# Patient Record
Sex: Male | Born: 1945 | Race: White | Hispanic: No | Marital: Single | Smoking: Never smoker
Health system: Southern US, Community
[De-identification: ages and names within clinical notes are randomized; demographics above are authoritative.]

## PROBLEM LIST (undated history)

## (undated) DIAGNOSIS — F329 Major depressive disorder, single episode, unspecified: Secondary | ICD-10-CM

## (undated) DIAGNOSIS — F419 Anxiety disorder, unspecified: Secondary | ICD-10-CM

## (undated) DIAGNOSIS — K219 Gastro-esophageal reflux disease without esophagitis: Secondary | ICD-10-CM

## (undated) DIAGNOSIS — F32A Depression, unspecified: Secondary | ICD-10-CM

## (undated) DIAGNOSIS — G459 Transient cerebral ischemic attack, unspecified: Secondary | ICD-10-CM

## (undated) DIAGNOSIS — I10 Essential (primary) hypertension: Secondary | ICD-10-CM

## (undated) HISTORY — PX: APPENDECTOMY: SHX54

## (undated) HISTORY — PX: ROTATOR CUFF REPAIR: SHX139

## (undated) HISTORY — PX: BACK SURGERY: SHX140

---
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
## 2015-07-04 ENCOUNTER — Emergency Department
Admission: EM | Admit: 2015-07-04 | Discharge: 2015-07-04 | Disposition: A | Discharge status: Home / Self Care | Payer: Medicare Other | Attending: Emergency Medicine | Admitting: Emergency Medicine

## 2015-07-04 ENCOUNTER — Encounter: Payer: Self-pay | Admitting: Emergency Medicine

## 2015-07-04 DIAGNOSIS — S61432A Puncture wound without foreign body of left hand, initial encounter: Secondary | ICD-10-CM | POA: Diagnosis not present

## 2015-07-04 DIAGNOSIS — Y929 Unspecified place or not applicable: Secondary | ICD-10-CM | POA: Diagnosis not present

## 2015-07-04 DIAGNOSIS — T148XXA Other injury of unspecified body region, initial encounter: Secondary | ICD-10-CM

## 2015-07-04 DIAGNOSIS — Y999 Unspecified external cause status: Secondary | ICD-10-CM | POA: Diagnosis not present

## 2015-07-04 DIAGNOSIS — S6992XA Unspecified injury of left wrist, hand and finger(s), initial encounter: Secondary | ICD-10-CM | POA: Diagnosis present

## 2015-07-04 DIAGNOSIS — W273XXA Contact with needle (sewing), initial encounter: Secondary | ICD-10-CM | POA: Diagnosis not present

## 2015-07-04 DIAGNOSIS — Y9389 Activity, other specified: Secondary | ICD-10-CM | POA: Diagnosis not present

## 2015-07-04 DIAGNOSIS — L03114 Cellulitis of left upper limb: Secondary | ICD-10-CM | POA: Insufficient documentation

## 2015-07-04 LAB — BASIC METABOLIC PANEL
Anion gap: 8 (ref 5–15)
BUN: 33 mg/dL — ABNORMAL HIGH (ref 6–20)
CALCIUM: 9.6 mg/dL (ref 8.9–10.3)
CHLORIDE: 104 mmol/L (ref 101–111)
CO2: 28 mmol/L (ref 22–32)
Creatinine, Ser: 1.59 mg/dL — ABNORMAL HIGH (ref 0.61–1.24)
GFR calc non Af Amer: 43 mL/min — ABNORMAL LOW (ref >60)
GFR, EST AFRICAN AMERICAN: 50 mL/min — AB (ref >60)
Glucose, Bld: 103 mg/dL — ABNORMAL HIGH (ref 65–99)
POTASSIUM: 4.1 mmol/L (ref 3.5–5.1)
Sodium: 140 mmol/L (ref 135–145)

## 2015-07-04 LAB — CBC
HEMATOCRIT: 39.7 % — AB (ref 40.0–52.0)
HEMOGLOBIN: 12.9 g/dL — AB (ref 13.0–18.0)
MCH: 29.5 pg (ref 26.0–34.0)
MCHC: 32.5 g/dL (ref 32.0–36.0)
MCV: 90.7 fL (ref 80.0–100.0)
Platelets: 172 10*3/uL (ref 150–440)
RBC: 4.38 MIL/uL — ABNORMAL LOW (ref 4.40–5.90)
RDW: 14 % (ref 11.5–14.5)
WBC: 9.5 10*3/uL (ref 3.8–10.6)

## 2015-07-04 MED ORDER — CEPHALEXIN 500 MG PO CAPS: 500.0000 mg | ORAL_CAPSULE | Freq: Four times a day (QID) | ORAL | Status: AC

## 2015-07-04 MED ORDER — SULFAMETHOXAZOLE-TRIMETHOPRIM 800-160 MG PO TABS: 1.0000 | ORAL_TABLET | Freq: Two times a day (BID) | ORAL | Status: DC

## 2015-07-04 MED ORDER — PIPERACILLIN-TAZOBACTAM 3.375 G IVPB
3.3750 g | Freq: Once | INTRAVENOUS | Status: AC
  Administered 2015-07-04: 3.375 g via INTRAVENOUS
  Filled 2015-07-04: qty 50

## 2015-07-04 MED ORDER — VANCOMYCIN HCL IN DEXTROSE 1-5 GM/200ML-% IV SOLN
1000.0000 mg | Freq: Once | INTRAVENOUS | Status: AC
  Administered 2015-07-04: 1000 mg via INTRAVENOUS
  Filled 2015-07-04: qty 200

## 2015-07-04 NOTE — ED Provider Notes (Signed)
[facility] Emergency Department Provider Note        Time seen: ----------------------------------------- 4:03 PM on 07/04/2015 -----------------------------------------    I have reviewed the triage vital signs and the nursing notes.   HISTORY  Chief Complaint Cellulitis    HPI Frederick Hood is a 70 y.o. male who presents to ER for left hand redness swelling after accidentallyinjecting himself with cattle vaccine yesterday. This was cleared from a poison control perspective. Patient states the needle he stuck himself with had probably been used on 10 cattle. He notes over the last 24 hours redness and swelling has progressed around his hand and wrist. He complains of mild pain and a burning sensation in his hand. Hand is markedly swollen.   History reviewed. No pertinent past medical history.  There are no active problems to display for this patient.   Past Surgical History  Procedure Laterality Date  . Back surgery    . Appendectomy      Allergies Review of patient's allergies indicates no known allergies.  Social History Social History  Substance Use Topics  . Smoking status: Never Smoker   . Smokeless tobacco: None  . Alcohol Use: Yes   Review of Systems Constitutional: Negative for fever. Cardiovascular: Negative for chest pain. Respiratory: Negative for shortness of breath. Musculoskeletal: Positive for left hand swelling Skin: Positive for left hand and wrist erythema with swelling Neurological: Negative for headaches, focal weakness or numbness.  10-point ROS otherwise negative.  ____________________________________________   PHYSICAL EXAM:  VITAL SIGNS: ED Triage Vitals  Enc Vitals Group     BP 07/04/15 1403 130/63 mmHg     Pulse Rate 07/04/15 1403 60     Resp 07/04/15 1403 18     Temp --      Temp Source 07/04/15 1403 Oral     SpO2 07/04/15 1403 99 %     Weight 07/04/15 1403 180 lb (81.647 kg)     Height 07/04/15  1403 6\' 1"  (1.854 m)     Head Cir --      Peak Flow --      Pain Score 07/04/15 1409 0     Pain Loc --      Pain Edu? --      Excl. in GC? --    Constitutional: Alert and oriented. Well appearing and in no distress. Eyes: Conjunctivae are normal. PERRL. Normal extraocular movements. ENT   Head: Normocephalic and atraumatic.   Nose: No congestion/rhinnorhea.   Mouth/Throat: Mucous membranes are moist.   Neck: No stridor. Cardiovascular: Normal rate, regular rhythm. No murmurs, rubs, or gallops. Respiratory: Normal respiratory effort without tachypnea nor retractions. Breath sounds are clear and equal bilaterally. No wheezes/rales/rhonchi. Musculoskeletal: Left hand is diffusely swollen, particularly over the palmar aspect. It is of erythema over the palmar aspect extending into the wrist medially. There is a puncture wound noted over the mid-palmar aspect. He has limited but grossly unremarkable range of motion of his fingers. Neurologic:  Normal speech and language. No gross focal neurologic deficits are appreciated.  Skin:  Erythema, edema is noted in the left hand Psychiatric: Mood and affect are normal. Speech and behavior are normal.   ____________________________________________  ED COURSE:  Pertinent labs & imaging results that were available during my care of the patient were reviewed by me and considered in my medical decision making (see chart for details). Patient is in no acute distress, there is concern for developing infection in his left hand. He'll  receive IV vancomycin and Zosyn. I will discuss with orthopedics. ____________________________________________    LABS (pertinent positives/negatives)  Labs Reviewed  CBC - Abnormal; Notable for the following:    RBC 4.38 (*)    Hemoglobin 12.9 (*)    HCT 39.7 (*)    All other components within normal limits  BASIC METABOLIC PANEL - Abnormal; Notable for the following:    Glucose, Bld 103 (*)    BUN 33  (*)    Creatinine, Ser 1.59 (*)    GFR calc non Af Amer 43 (*)    GFR calc Af Amer 50 (*)    All other components within normal limits   ____________________________________________  FINAL ASSESSMENT AND PLAN  Cellulitis, puncture wound  Plan: Patient with labs as dictated above. Patient has received IV vancomycin and Zosyn here. Patient has no appointment tomorrow at 9:45 AM with Dr. Hyacinth Meeker. We will place him in a wrist brace, he'll be discharged with Septra and Keflex is advised to return for worsening symptoms.   Emily Filbert, MD   Note: This dictation was prepared with Dragon dictation. Any transcriptional errors that result from this process are unintentional   Emily Filbert, MD 07/04/15 1732

## 2015-07-04 NOTE — ED Notes (Signed)
Pt present to ED with left hand redness and swelling after accidentally injecting himself with a cattle vaccine yesterday. Pt states the needle he stuck himself with had probably been used on 10 cattle.

## 2015-07-04 NOTE — Discharge Instructions (Signed)
Cellulitis Cellulitis is an infection of the skin and the tissue beneath it. The infected area is usually red and tender. Cellulitis occurs most often in the arms and lower legs.  CAUSES  Cellulitis is caused by bacteria that enter the skin through cracks or cuts in the skin. The most common types of bacteria that cause cellulitis are staphylococci and streptococci. SIGNS AND SYMPTOMS   Redness and warmth.  Swelling.  Tenderness or pain.  Fever. DIAGNOSIS  Your health care provider can usually determine what is wrong based on a physical exam. Blood tests may also be done. TREATMENT  Treatment usually involves taking an antibiotic medicine. HOME CARE INSTRUCTIONS   Take your antibiotic medicine as directed by your health care provider. Finish the antibiotic even if you start to feel better.  Keep the infected arm or leg elevated to reduce swelling.  Apply a warm cloth to the affected area up to 4 times per day to relieve pain.  Take medicines only as directed by your health care provider.  Keep all follow-up visits as directed by your health care provider. SEEK MEDICAL CARE IF:   You notice red streaks coming from the infected area.  Your red area gets larger or turns dark in color.  Your bone or joint underneath the infected area becomes painful after the skin has healed.  Your infection returns in the same area or another area.  You notice a swollen bump in the infected area.  You develop new symptoms.  You have a fever. SEEK IMMEDIATE MEDICAL CARE IF:   You feel very sleepy.  You develop vomiting or diarrhea.  You have a general ill feeling (malaise) with muscle aches and pains.   This information is not intended to replace advice given to you by your health care provider. Make sure you discuss any questions you have with your health care provider.   Document Released: 11/19/2004 Document Revised: 10/31/2014 Document Reviewed: 04/27/2011 Elsevier Interactive  Patient Education 2016 Elsevier Inc. Puncture Wound A puncture wound is an injury that is caused by a sharp, thin object that goes through (penetrates) your skin. Usually, a puncture wound does not leave a large opening in your skin, so it may not bleed a lot. However, when you get a puncture wound, dirt or other materials (foreign bodies) can be forced into your wound and break off inside. This increases the chance of infection, such as tetanus. CAUSES Puncture wounds are caused by any sharp, thin object that goes through your skin, such as:  Animal teeth, as with an animal bite.  Sharp, pointed objects, such as nails, splinters of glass, fishhooks, and needles. SYMPTOMS Symptoms of a puncture wound include:  Pain.  Bleeding.  Swelling.  Bruising.  Fluid leaking from the wound.  Numbness, tingling, or loss of function. DIAGNOSIS This condition is diagnosed with a medical history and physical exam. Your wound will be checked to see if it contains any foreign bodies. You may also have X-rays or other imaging tests. TREATMENT Treatment for a puncture wound depends on how serious the wound is. It also depends on whether the wound contains any foreign bodies. Treatment for all types of puncture wounds usually starts with:  Controlling the bleeding.  Washing out the wound with a germ-free (sterile) salt-water solution.  Checking the wound for foreign bodies. Treatment may also include:  Having the wound opened surgically to remove a foreign object.  Closing the wound with stitches (sutures) if it continues to bleed.  Covering the wound with antibiotic ointments and a bandage (dressing).  Receiving a tetanus shot.  Receiving a rabies vaccine. HOME CARE INSTRUCTIONS Medicines  Take or apply over-the-counter and prescription medicines only as told by your health care provider.  If you were prescribed an antibiotic, take or apply it as told by your health care provider. Do  not stop using the antibiotic even if your condition improves. Wound Care  There are many ways to close and cover a wound. For example, a wound can be covered with sutures, skin glue, or adhesive strips. Follow instructions from your health care provider about:  How to take care of your wound.  When and how you should change your dressing.  When you should remove your dressing.  Removing whatever was used to close your wound.  Keep the dressing dry as told by your health care provider. Do not take baths, swim, use a hot tub, or do anything that would put your wound underwater until your health care provider approves.  Clean the wound as told by your health care provider.  Do not scratch or pick at the wound.  Check your wound every day for signs of infection. Watch for:  Redness, swelling, or pain.  Fluid, blood, or pus. General Instructions  Raise (elevate) the injured area above the level of your heart while you are sitting or lying down.  If your puncture wound is in your foot, ask your health care provider if you need to avoid putting weight on your foot and for how long.  Keep all follow-up visits as told by your health care provider. This is important. SEEK MEDICAL CARE IF:  You received a tetanus shot and you have swelling, severe pain, redness, or bleeding at the injection site.  You have a fever.  Your sutures come out.  You notice a bad smell coming from your wound or your dressing.  You notice something coming out of your wound, such as wood or glass.  Your pain is not controlled with medicine.  You have increased redness, swelling, or pain at the site of your wound.  You have fluid, blood, or pus coming from your wound.  You notice a change in the color of your skin near your wound.  You need to change the dressing frequently due to fluid, blood, or pus draining from your wound.  You develop a new rash.  You develop numbness around your wound. SEEK  IMMEDIATE MEDICAL CARE IF:  You develop severe swelling around your wound.  Your pain suddenly increases and is severe.  You develop painful skin lumps.  You have a red streak going away from your wound.  The wound is on your hand or foot and you cannot properly move a finger or toe.  The wound is on your hand or foot and you notice that your fingers or toes look pale or bluish.   This information is not intended to replace advice given to you by your health care provider. Make sure you discuss any questions you have with your health care provider.   Document Released: 11/19/2004 Document Revised: 10/31/2014 Document Reviewed: 04/04/2014 Elsevier Interactive Patient Education [employer]2016 Elsevier Inc.

## 2019-01-03 ENCOUNTER — Emergency Department
Admission: EM | Admit: 2019-01-03 | Discharge: 2019-01-03 | Disposition: A | Discharge status: Home / Self Care | Payer: Medicare Other | Attending: Emergency Medicine | Admitting: Emergency Medicine

## 2019-01-03 ENCOUNTER — Other Ambulatory Visit: Payer: Self-pay

## 2019-01-03 ENCOUNTER — Encounter: Payer: Self-pay | Admitting: Emergency Medicine

## 2019-01-03 ENCOUNTER — Emergency Department: Payer: Medicare Other

## 2019-01-03 DIAGNOSIS — M5441 Lumbago with sciatica, right side: Secondary | ICD-10-CM | POA: Insufficient documentation

## 2019-01-03 DIAGNOSIS — Z79899 Other long term (current) drug therapy: Secondary | ICD-10-CM | POA: Diagnosis not present

## 2019-01-03 DIAGNOSIS — M48061 Spinal stenosis, lumbar region without neurogenic claudication: Secondary | ICD-10-CM

## 2019-01-03 DIAGNOSIS — M545 Low back pain: Secondary | ICD-10-CM | POA: Diagnosis present

## 2019-01-03 DIAGNOSIS — G8929 Other chronic pain: Secondary | ICD-10-CM | POA: Diagnosis not present

## 2019-01-03 MED ORDER — ACETAMINOPHEN 325 MG PO TABS
650.0000 mg | ORAL_TABLET | Freq: Once | ORAL | Status: AC
  Administered 2019-01-03: 650 mg via ORAL
  Filled 2019-01-03: qty 2

## 2019-01-03 MED ORDER — METHOCARBAMOL 500 MG PO TABS: 500.0000 mg | ORAL_TABLET | Freq: Four times a day (QID) | ORAL | 0 refills | Status: AC

## 2019-01-03 MED ORDER — PREDNISONE 20 MG PO TABS
60.0000 mg | ORAL_TABLET | Freq: Once | ORAL | Status: AC
  Administered 2019-01-03: 16:00:00 60 mg via ORAL
  Filled 2019-01-03: qty 3

## 2019-01-03 MED ORDER — GABAPENTIN 300 MG PO CAPS: 300.0000 mg | ORAL_CAPSULE | Freq: Every day | ORAL | 0 refills | Status: DC

## 2019-01-03 MED ORDER — PREDNISONE 20 MG PO TABS: 40.0000 mg | ORAL_TABLET | Freq: Every day | ORAL | 0 refills | Status: AC

## 2019-01-03 NOTE — ED Triage Notes (Signed)
Pt reports has back surgery in 2011. Pt reports intermittent back pain since then. Pt states for the last few days has had pain to his lower back that radiates down his right leg and makes it hard for him to walk.

## 2019-01-03 NOTE — ED Provider Notes (Signed)
[facility] Emergency Department Provider Note ____________________________________________  Time seen: 1215  I have reviewed the triage vital signs and the nursing notes.  HISTORY  Chief Complaint  Back Pain  HPI Frederick Hood is a 73 y.o. male presents to the ED accompanied by his girlfriend, for management of several days of right LBP and RLE referral. He gives a history of DDD and previous ESI. His most recent MRI was performed in 06/2017 at [facility]. He admits to 3-4 days of right LBP and radiculopathy. He reports pain in the right buttocks region and down the posterior right thigh.  Denies any bladder or bowel incontinence, foot drop, or saddle anesthesia.  He does report ankle weakness at baseline from his previous sciatic nerve irritation.  Patient does not take any current medications for his symptoms.  He has a pending appointment with Dr. Marcell Barlow at [facility] neurology on this calendar.  He also apparently has an appointment with a [facility] neurologist that he was able to secure.  Patient will likely cancel the appointment at [facility] and lieu of more local provider.  History reviewed. No pertinent past medical history.  There are no active problems to display for this patient.   Past Surgical History:  Procedure Laterality Date  . APPENDECTOMY    . BACK SURGERY      Prior to Admission medications   Medication Sig Start Date End Date Taking? Authorizing Provider  gabapentin (NEURONTIN) 300 MG capsule Take 1 capsule (300 mg total) by mouth at bedtime for 15 days. 01/03/19 01/18/19  Karlin Binion, Charlesetta Ivory, PA-C  methocarbamol (ROBAXIN) 500 MG tablet Take 1 tablet (500 mg total) by mouth 4 (four) times daily for 7 days. 01/03/19 01/10/19  Khiya Friese, Charlesetta Ivory, PA-C  predniSONE (DELTASONE) 20 MG tablet Take 2 tablets (40 mg total) by mouth daily with breakfast for 5 days. 01/03/19 01/08/19  Zayn Selley, Charlesetta Ivory, PA-C   sulfamethoxazole-trimethoprim (BACTRIM DS) 800-160 MG tablet Take 1 tablet by mouth 2 (two) times daily. 07/04/15   Emily Filbert, MD    Allergies Patient has no known allergies.  No family history on file.  Social History Social History   Tobacco Use  . Smoking status: Never Smoker  Substance Use Topics  . Alcohol use: Yes  . Drug use: No    Review of Systems  Constitutional: Negative for fever. Eyes: Negative for visual changes. ENT: Negative for sore throat. Cardiovascular: Negative for chest pain. Respiratory: Negative for shortness of breath. Gastrointestinal: Negative for abdominal pain, vomiting and diarrhea. Genitourinary: Negative for dysuria. Musculoskeletal: Positive for back pain. Skin: Negative for rash. Neurological: Negative for headaches, focal weakness or numbness. ____________________________________________  PHYSICAL EXAM:  VITAL SIGNS: ED Triage Vitals [01/03/19 1109]  Enc Vitals Group     BP      Pulse      Resp      Temp      Temp src      SpO2      Weight 180 lb (81.6 kg)     Height 6' (1.829 m)     Head Circumference      Peak Flow      Pain Score 8     Pain Loc      Pain Edu?      Excl. in GC?     Constitutional: Alert and oriented. Well appearing and in no distress. Head: Normocephalic and atraumatic. Eyes: Conjunctivae are normal. Normal extraocular movements Cardiovascular:  Normal rate, regular rhythm. Normal distal pulses. Respiratory: Normal respiratory effort. No wheezes/rales/rhonchi. Gastrointestinal: Soft and nontender. No distention. Musculoskeletal: Normal spinal alignment without midline tenderness, spasm, deformity, or step-off.  Patient with pain to the right sacroiliac region of the right buttocks.  He also reports referred pain down the posterior thigh to the posterior knee.  Patient is able demonstrate normal hip flexion and extension range in the supine position.  He is also able demonstrate a normal leg  extension in a supine position.  Nontender with normal range of motion in all extremities.  Neurologic: Cranial nerves II through XII grossly intact.  Normal LE DTRs bilaterally.  Antalgic gait without ataxia. Normal speech and language. No gross focal neurologic deficits are appreciated. Skin:  Skin is warm, dry and intact. No rash noted. ____________________________________________   RADIOLOGY  MRI Lumbar Spine   IMPRESSION: Multilevel degenerative changes as detailed above with some progression since 2014. Canal stenosis is greatest at L2-L3. Right foraminal stenosis is greatest at L4-L5 and L5-S1. ____________________________________________  PROCEDURES  Tylenol 650 mg PO Prednisone 60 mg PO Procedures ____________________________________________  INITIAL IMPRESSION / ASSESSMENT AND PLAN / ED COURSE  Patient with ED evaluation of acute on chronic low back pain secondary to underlying DDD, spinal stenosis, and foraminal stenosis.  Patient presented with acute right low back pain and radicular symptoms over the last few days.  Patient is adamant about not taking muscle relaxants and narcotic pain medicines for his symptoms.  He did agree to take a less sedating muscle relaxant as well as steroids for his acute radiculopathy.  He will follow-up with neurosurgery as scheduled, for ongoing symptom management and intervention.  Return precautions have been reviewed.  Patient and his girlfriend were very appreciative of the care received in the ED today.  He is discharged to follow-up as planned.  Frederick Hood was evaluated in Emergency Department on 01/03/2019 for the symptoms described in the history of present illness. He was evaluated in the context of the global COVID-19 pandemic, which necessitated consideration that the patient might be at risk for infection with the SARS-CoV-2 virus that causes COVID-19. Institutional protocols and algorithms that pertain to the evaluation of patients  at risk for COVID-19 are in a state of rapid change based on information released by regulatory bodies including the CDC and federal and state organizations. These policies and algorithms were followed during the patient's care in the ED. ____________________________________________  FINAL CLINICAL IMPRESSION(S) / ED DIAGNOSES  Final diagnoses:  Chronic right-sided low back pain with right-sided sciatica  Spinal stenosis at L4-L5 level      Taitum Alms, Dannielle Karvonen, PA-C 01/03/19 1625    Vanessa Thomaston, MD 01/04/19 1247

## 2019-01-03 NOTE — Discharge Instructions (Addendum)
Your exam and MRI confirm progression of your chronic of DDD, Spinal stenosis, and foraminal stenosis. Take the prescription meds as directed. Follow-up with [facility] as scheduled.

## 2019-01-03 NOTE — ED Notes (Signed)
Patient wheeled back to room by this RN. Patient here for chronic lower back pain. Denies recent injury or trauma. Denies urinary incontinence. Reports difficulty ambulating.

## 2019-02-09 ENCOUNTER — Ambulatory Visit
Admission: RE | Admit: 2019-02-09 | Discharge: 2019-02-09 | Disposition: A | Discharge status: Home / Self Care | Payer: Medicare Other | Source: Ambulatory Visit | Attending: Neurosurgery | Admitting: Neurosurgery

## 2019-02-09 ENCOUNTER — Other Ambulatory Visit: Payer: Self-pay | Admitting: Neurosurgery

## 2019-02-09 DIAGNOSIS — M48062 Spinal stenosis, lumbar region with neurogenic claudication: Secondary | ICD-10-CM

## 2019-04-05 ENCOUNTER — Other Ambulatory Visit: Payer: Self-pay | Admitting: Neurosurgery

## 2019-04-27 ENCOUNTER — Encounter
Admission: RE | Admit: 2019-04-27 | Discharge: 2019-04-27 | Disposition: A | Discharge status: Home / Self Care | Payer: Medicare Other | Source: Ambulatory Visit | Attending: Neurosurgery | Admitting: Neurosurgery

## 2019-04-27 ENCOUNTER — Other Ambulatory Visit: Payer: Self-pay

## 2019-04-27 HISTORY — DX: Essential (primary) hypertension: I10

## 2019-04-27 HISTORY — DX: Transient cerebral ischemic attack, unspecified: G45.9

## 2019-04-27 HISTORY — DX: Gastro-esophageal reflux disease without esophagitis: K21.9

## 2019-04-27 HISTORY — DX: Anxiety disorder, unspecified: F41.9

## 2019-04-27 HISTORY — DX: Depression, unspecified: F32.A

## 2019-04-27 NOTE — Patient Instructions (Signed)
Your procedure is scheduled on: 05/03/19 Report to [place]. To find out your arrival time please call [phone] between 1PM - 3PM on 05/02/19.  Remember: Instructions that are not followed completely may result in serious medical risk, up to and including death, or upon the discretion of your surgeon and anesthesiologist your surgery may need to be rescheduled.     _X__ 1. Do not eat food after midnight the night before your procedure.                 No gum chewing or hard candies. You may drink clear liquids up to 2 hours                 before you are scheduled to arrive for your surgery- DO not drink clear                 liquids within 2 hours of the start of your surgery.                 Clear Liquids include:  water, apple juice without pulp, clear carbohydrate                 drink such as Clearfast or Gatorade, Black Coffee or Tea (Do not add                 anything to coffee or tea). Diabetics water only  __X__2.  On the morning of surgery brush your teeth with toothpaste and water, you                 may rinse your mouth with mouthwash if you wish.  Do not swallow any              toothpaste of mouthwash.     _X__ 3.  No Alcohol for 24 hours before or after surgery.   _X__ 4.  Do Not Smoke or use e-cigarettes For 24 Hours Prior to Your Surgery.                 Do not use any chewable tobacco products for at least 6 hours prior to                 surgery.  ____  5.  Bring all medications with you on the day of surgery if instructed.   __X__  6.  Notify your doctor if there is any change in your medical condition      (cold, fever, infections).     Do not wear jewelry, make-up, hairpins, clips or nail polish. Do not wear lotions, powders, or perfumes.  Do not shave 48 hours prior to surgery. Men may shave face and neck. Do not bring valuables to the hospital.    [facility] is not responsible for any belongings or  valuables.  Contacts, dentures/partials or body piercings may not be worn into surgery. Bring a case for your contacts, glasses or hearing aids, a denture cup will be supplied. Leave your suitcase in the car. After surgery it may be brought to your room. For patients admitted to the hospital, discharge time is determined by your treatment team.   Patients discharged the day of surgery will not be allowed to drive home.   Please read over the following fact sheets that you were given:   MRSA Information  __X__ Take these medicines the morning of surgery with A SIP OF WATER:  1. citalopram (CELEXA) 20 MG tablet  2. famotidine (PEPCID) 20 MG tablet  3. metoprolol succinate (TOPROL-XL) 50 MG 24 hr tablet  4.  5.  6.  ____ Fleet Enema (as directed)   __X__ Use CHG Soap/SAGE wipes as directed  ____ Use inhalers on the day of surgery  ____ Stop metformin/Janumet/Farxiga 2 days prior to surgery    ____ Take 1/2 of usual insulin dose the night before surgery. No insulin the morning          of surgery.   __X__ Stop Blood Thinners Coumadin/Plavix/Xarelto/Pleta/Pradaxa/Eliquis/Effient (continue aspirin as instructed,  on   Or contact your Surgeon, Cardiologist or Medical Doctor regarding  ability to stop your blood thinners  __X__ Stop Anti-inflammatories 7 days before surgery such as Advil, Ibuprofen, Motrin,  BC or Goodies Powder, Naprosyn, Naproxen, Aleve    __X__ Stop all herbal supplements, fish oil or vitamin E until after surgery.    ____ Bring C-Pap to the hospital.

## 2019-05-01 ENCOUNTER — Encounter
Admission: RE | Admit: 2019-05-01 | Discharge: 2019-05-01 | Disposition: A | Discharge status: Home / Self Care | Payer: Medicare Other | Source: Ambulatory Visit | Attending: Neurosurgery | Admitting: Neurosurgery

## 2019-05-01 ENCOUNTER — Other Ambulatory Visit: Payer: Self-pay

## 2019-05-01 LAB — BASIC METABOLIC PANEL
Anion gap: 7 (ref 5–15)
BUN: 17 mg/dL (ref 8–23)
CO2: 27 mmol/L (ref 22–32)
Calcium: 9.1 mg/dL (ref 8.9–10.3)
Chloride: 106 mmol/L (ref 98–111)
Creatinine, Ser: 1.03 mg/dL (ref 0.61–1.24)
GFR calc Af Amer: >60 mL/min (ref >60)
GFR calc non Af Amer: >60 mL/min (ref >60)
Glucose, Bld: 86 mg/dL (ref 70–99)
Potassium: 3.9 mmol/L (ref 3.5–5.1)
Sodium: 140 mmol/L (ref 135–145)

## 2019-05-01 LAB — CBC
HCT: 39.5 % (ref 39.0–52.0)
Hemoglobin: 13 g/dL (ref 13.0–17.0)
MCH: 30.8 pg (ref 26.0–34.0)
MCHC: 32.9 g/dL (ref 30.0–36.0)
MCV: 93.6 fL (ref 80.0–100.0)
Platelets: 182 10*3/uL (ref 150–400)
RBC: 4.22 MIL/uL (ref 4.22–5.81)
RDW: 12.9 % (ref 11.5–15.5)
WBC: 6.7 10*3/uL (ref 4.0–10.5)
nRBC: 0 % (ref 0.0–0.2)

## 2019-05-01 LAB — URINALYSIS, ROUTINE W REFLEX MICROSCOPIC
Bilirubin Urine: NEGATIVE
Glucose, UA: NEGATIVE mg/dL
Hgb urine dipstick: NEGATIVE
Ketones, ur: NEGATIVE mg/dL
Leukocytes,Ua: NEGATIVE
Nitrite: NEGATIVE
Protein, ur: NEGATIVE mg/dL
Specific Gravity, Urine: 1.023 (ref 1.005–1.030)
pH: 5 (ref 5.0–8.0)

## 2019-05-01 LAB — TYPE AND SCREEN
ABO/RH(D): O POS
Antibody Screen: NEGATIVE

## 2019-05-01 LAB — APTT: aPTT: 35 seconds (ref 24–36)

## 2019-05-01 LAB — SURGICAL PCR SCREEN
MRSA, PCR: NEGATIVE
Staphylococcus aureus: POSITIVE — AB

## 2019-05-01 LAB — PROTIME-INR
INR: 1 (ref 0.8–1.2)
Prothrombin Time: 13.4 seconds (ref 11.4–15.2)

## 2019-05-01 LAB — SARS CORONAVIRUS 2 (TAT 6-24 HRS): SARS Coronavirus 2: NEGATIVE

## 2019-05-03 ENCOUNTER — Other Ambulatory Visit: Payer: Self-pay

## 2019-05-03 ENCOUNTER — Inpatient Hospital Stay: Payer: Medicare Other | Admitting: Registered Nurse

## 2019-05-03 ENCOUNTER — Inpatient Hospital Stay: Payer: Medicare Other

## 2019-05-03 ENCOUNTER — Inpatient Hospital Stay
Admission: RE | Admit: 2019-05-03 | Discharge: 2019-05-05 | DRG: 455 | Disposition: A | Discharge status: Home Health | Payer: Medicare Other | Attending: Neurosurgery | Admitting: Neurosurgery

## 2019-05-03 ENCOUNTER — Encounter: Payer: Self-pay | Admitting: Neurosurgery

## 2019-05-03 ENCOUNTER — Encounter
Admission: RE | Disposition: A | Discharge status: Home / Self Care | Payer: Self-pay | Source: Home / Self Care | Attending: Neurosurgery

## 2019-05-03 DIAGNOSIS — Z7902 Long term (current) use of antithrombotics/antiplatelets: Secondary | ICD-10-CM

## 2019-05-03 DIAGNOSIS — M4316 Spondylolisthesis, lumbar region: Principal | ICD-10-CM | POA: Diagnosis present

## 2019-05-03 DIAGNOSIS — F329 Major depressive disorder, single episode, unspecified: Secondary | ICD-10-CM | POA: Diagnosis present

## 2019-05-03 DIAGNOSIS — Z7982 Long term (current) use of aspirin: Secondary | ICD-10-CM | POA: Diagnosis not present

## 2019-05-03 DIAGNOSIS — F419 Anxiety disorder, unspecified: Secondary | ICD-10-CM | POA: Diagnosis present

## 2019-05-03 DIAGNOSIS — I10 Essential (primary) hypertension: Secondary | ICD-10-CM | POA: Diagnosis present

## 2019-05-03 DIAGNOSIS — K219 Gastro-esophageal reflux disease without esophagitis: Secondary | ICD-10-CM | POA: Diagnosis present

## 2019-05-03 DIAGNOSIS — Z20822 Contact with and (suspected) exposure to covid-19: Secondary | ICD-10-CM | POA: Diagnosis present

## 2019-05-03 DIAGNOSIS — M4186 Other forms of scoliosis, lumbar region: Secondary | ICD-10-CM | POA: Diagnosis present

## 2019-05-03 DIAGNOSIS — M419 Scoliosis, unspecified: Secondary | ICD-10-CM | POA: Diagnosis present

## 2019-05-03 DIAGNOSIS — Z419 Encounter for procedure for purposes other than remedying health state, unspecified: Secondary | ICD-10-CM

## 2019-05-03 DIAGNOSIS — Z8673 Personal history of transient ischemic attack (TIA), and cerebral infarction without residual deficits: Secondary | ICD-10-CM

## 2019-05-03 DIAGNOSIS — Z79899 Other long term (current) drug therapy: Secondary | ICD-10-CM | POA: Diagnosis not present

## 2019-05-03 DIAGNOSIS — Z981 Arthrodesis status: Secondary | ICD-10-CM

## 2019-05-03 HISTORY — PX: ANTERIOR LATERAL LUMBAR FUSION WITH PERCUTANEOUS SCREW 3 LEVEL: SHX5555

## 2019-05-03 LAB — ABO/RH: ABO/RH(D): O POS

## 2019-05-03 SURGERY — ANTERIOR LATERAL LUMBAR FUSION WITH PERCUTANEOUS SCREW 3 LEVEL
Anesthesia: General

## 2019-05-03 MED ORDER — FENTANYL CITRATE (PF) 100 MCG/2ML IJ SOLN
INTRAMUSCULAR | Status: AC
  Filled 2019-05-03: qty 2

## 2019-05-03 MED ORDER — SODIUM CHLORIDE 0.9 % IV SOLN: INTRAVENOUS | Status: DC

## 2019-05-03 MED ORDER — ONDANSETRON HCL 4 MG PO TABS: 4.0000 mg | ORAL_TABLET | Freq: Four times a day (QID) | ORAL | Status: DC | PRN

## 2019-05-03 MED ORDER — SUCCINYLCHOLINE CHLORIDE 20 MG/ML IJ SOLN
INTRAMUSCULAR | Status: DC | PRN
  Administered 2019-05-03: 100 mg via INTRAVENOUS

## 2019-05-03 MED ORDER — METOPROLOL SUCCINATE ER 50 MG PO TB24
50.0000 mg | ORAL_TABLET | Freq: Every day | ORAL | Status: DC
  Administered 2019-05-04 – 2019-05-05 (×2): 50 mg via ORAL
  Filled 2019-05-03 (×2): qty 1

## 2019-05-03 MED ORDER — REMIFENTANIL HCL 1 MG IV SOLR
INTRAVENOUS | Status: AC
  Filled 2019-05-03: qty 1000

## 2019-05-03 MED ORDER — MIDAZOLAM HCL 2 MG/2ML IJ SOLN
INTRAMUSCULAR | Status: DC | PRN
  Administered 2019-05-03: 2 mg via INTRAVENOUS

## 2019-05-03 MED ORDER — SODIUM CHLORIDE 0.9 % IV SOLN: 250.0000 mL | INTRAVENOUS | Status: DC

## 2019-05-03 MED ORDER — VANCOMYCIN HCL 1.25 G IV SOLR
1250.0000 mg | Freq: Once | INTRAVENOUS | Status: AC
  Administered 2019-05-03: 1250 mg via INTRAVENOUS
  Filled 2019-05-03: qty 1250

## 2019-05-03 MED ORDER — GLYCOPYRROLATE 0.2 MG/ML IJ SOLN
INTRAMUSCULAR | Status: DC | PRN
  Administered 2019-05-03: .2 mg via INTRAVENOUS

## 2019-05-03 MED ORDER — KETAMINE HCL 50 MG/ML IJ SOLN
INTRAMUSCULAR | Status: DC | PRN
  Administered 2019-05-03: 50 mg via INTRAMUSCULAR

## 2019-05-03 MED ORDER — PROPOFOL 10 MG/ML IV BOLUS
INTRAVENOUS | Status: DC | PRN
  Administered 2019-05-03: 100 mg via INTRAVENOUS
  Administered 2019-05-03: 200 mg via INTRAVENOUS

## 2019-05-03 MED ORDER — METHOCARBAMOL 500 MG PO TABS
500.0000 mg | ORAL_TABLET | Freq: Four times a day (QID) | ORAL | Status: DC
  Administered 2019-05-03: 500 mg via ORAL
  Filled 2019-05-03: qty 1

## 2019-05-03 MED ORDER — PHENYLEPHRINE HCL-NACL 20-0.9 MG/250ML-% IV SOLN
INTRAVENOUS | Status: DC | PRN
  Administered 2019-05-03: 15 ug/min via INTRAVENOUS

## 2019-05-03 MED ORDER — LACTATED RINGERS IV SOLN: INTRAVENOUS | Status: DC

## 2019-05-03 MED ORDER — GLYCOPYRROLATE 0.2 MG/ML IJ SOLN: 0.2000 mg | Freq: Once | INTRAMUSCULAR | Status: AC

## 2019-05-03 MED ORDER — PROPOFOL 10 MG/ML IV BOLUS
INTRAVENOUS | Status: AC
  Filled 2019-05-03: qty 20

## 2019-05-03 MED ORDER — ACETAMINOPHEN 325 MG PO TABS
650.0000 mg | ORAL_TABLET | ORAL | Status: DC | PRN
  Administered 2019-05-05: 650 mg via ORAL
  Filled 2019-05-03 (×2): qty 2

## 2019-05-03 MED ORDER — SODIUM CHLORIDE (PF) 0.9 % IJ SOLN
INTRAMUSCULAR | Status: DC | PRN
  Administered 2019-05-03: 10 mL

## 2019-05-03 MED ORDER — KETAMINE HCL 50 MG/ML IJ SOLN
INTRAMUSCULAR | Status: AC
  Filled 2019-05-03: qty 10

## 2019-05-03 MED ORDER — CEFAZOLIN SODIUM-DEXTROSE 2-4 GM/100ML-% IV SOLN
INTRAVENOUS | Status: AC
  Filled 2019-05-03: qty 100

## 2019-05-03 MED ORDER — KETOROLAC TROMETHAMINE 30 MG/ML IJ SOLN
INTRAMUSCULAR | Status: AC
  Filled 2019-05-03: qty 1

## 2019-05-03 MED ORDER — BISACODYL 5 MG PO TBEC: 5.0000 mg | DELAYED_RELEASE_TABLET | Freq: Every day | ORAL | Status: DC | PRN

## 2019-05-03 MED ORDER — HYDROMORPHONE HCL 1 MG/ML IJ SOLN
INTRAMUSCULAR | Status: AC
  Administered 2019-05-03: 0.25 mg via INTRAVENOUS
  Filled 2019-05-03: qty 1

## 2019-05-03 MED ORDER — PROMETHAZINE HCL 25 MG/ML IJ SOLN: 6.2500 mg | INTRAMUSCULAR | Status: DC | PRN

## 2019-05-03 MED ORDER — BUPIVACAINE HCL (PF) 0.5 % IJ SOLN
INTRAMUSCULAR | Status: DC | PRN
  Administered 2019-05-03: 30 mL

## 2019-05-03 MED ORDER — OXYCODONE HCL 5 MG PO TABS
10.0000 mg | ORAL_TABLET | ORAL | Status: DC | PRN
  Administered 2019-05-04: 10 mg via ORAL
  Filled 2019-05-03 (×2): qty 2

## 2019-05-03 MED ORDER — FENTANYL CITRATE (PF) 100 MCG/2ML IJ SOLN
INTRAMUSCULAR | Status: AC
  Administered 2019-05-03: 50 ug via INTRAVENOUS
  Filled 2019-05-03: qty 2

## 2019-05-03 MED ORDER — PHENOL 1.4 % MT LIQD
1.0000 | OROMUCOSAL | Status: DC | PRN
  Filled 2019-05-03: qty 177

## 2019-05-03 MED ORDER — DEXAMETHASONE SODIUM PHOSPHATE 10 MG/ML IJ SOLN
INTRAMUSCULAR | Status: DC | PRN
  Administered 2019-05-03: 10 mg via INTRAVENOUS

## 2019-05-03 MED ORDER — MEPERIDINE HCL 50 MG/ML IJ SOLN: 6.2500 mg | INTRAMUSCULAR | Status: DC | PRN

## 2019-05-03 MED ORDER — HYDROMORPHONE HCL 1 MG/ML IJ SOLN
0.2500 mg | INTRAMUSCULAR | Status: DC | PRN
  Administered 2019-05-03 (×3): 0.25 mg via INTRAVENOUS

## 2019-05-03 MED ORDER — ACETAMINOPHEN 500 MG PO TABS
1000.0000 mg | ORAL_TABLET | Freq: Four times a day (QID) | ORAL | Status: AC
  Administered 2019-05-04 (×4): 1000 mg via ORAL
  Filled 2019-05-03 (×4): qty 2

## 2019-05-03 MED ORDER — PROPOFOL 500 MG/50ML IV EMUL
INTRAVENOUS | Status: AC
  Filled 2019-05-03: qty 100

## 2019-05-03 MED ORDER — REMIFENTANIL HCL 1 MG IV SOLR
INTRAVENOUS | Status: DC | PRN
  Administered 2019-05-03: .1 ug/kg/min via INTRAVENOUS

## 2019-05-03 MED ORDER — FENTANYL CITRATE (PF) 100 MCG/2ML IJ SOLN
INTRAMUSCULAR | Status: DC | PRN
  Administered 2019-05-03 (×3): 50 ug via INTRAVENOUS

## 2019-05-03 MED ORDER — PROPOFOL 500 MG/50ML IV EMUL
INTRAVENOUS | Status: AC
  Filled 2019-05-03: qty 50

## 2019-05-03 MED ORDER — SUCCINYLCHOLINE CHLORIDE 200 MG/10ML IV SOSY
PREFILLED_SYRINGE | INTRAVENOUS | Status: AC
  Filled 2019-05-03: qty 10

## 2019-05-03 MED ORDER — SODIUM CHLORIDE 0.9 % IV SOLN
INTRAVENOUS | Status: DC | PRN
  Administered 2019-05-03: 18:00:00 60 mL

## 2019-05-03 MED ORDER — ACETAMINOPHEN 650 MG RE SUPP: 650.0000 mg | RECTAL | Status: DC | PRN

## 2019-05-03 MED ORDER — HYDROMORPHONE HCL 1 MG/ML IJ SOLN
0.5000 mg | INTRAMUSCULAR | Status: DC | PRN
  Administered 2019-05-04 (×2): 0.5 mg via INTRAVENOUS
  Filled 2019-05-03 (×2): qty 1

## 2019-05-03 MED ORDER — ONDANSETRON HCL 4 MG/2ML IJ SOLN: 4.0000 mg | Freq: Four times a day (QID) | INTRAMUSCULAR | Status: DC | PRN

## 2019-05-03 MED ORDER — PROPOFOL 500 MG/50ML IV EMUL
INTRAVENOUS | Status: DC | PRN
  Administered 2019-05-03: 150 ug/kg/min via INTRAVENOUS

## 2019-05-03 MED ORDER — MAGNESIUM CITRATE PO SOLN
1.0000 | Freq: Once | ORAL | Status: DC | PRN
  Filled 2019-05-03: qty 296

## 2019-05-03 MED ORDER — ONDANSETRON HCL 4 MG/2ML IJ SOLN
INTRAMUSCULAR | Status: AC
  Filled 2019-05-03: qty 2

## 2019-05-03 MED ORDER — OXYCODONE HCL 5 MG/5ML PO SOLN: 5.0000 mg | Freq: Once | ORAL | Status: DC | PRN

## 2019-05-03 MED ORDER — THROMBIN 5000 UNITS EX SOLR
CUTANEOUS | Status: DC | PRN
  Administered 2019-05-03: 5000 [IU] via TOPICAL

## 2019-05-03 MED ORDER — CITALOPRAM HYDROBROMIDE 20 MG PO TABS
20.0000 mg | ORAL_TABLET | Freq: Every day | ORAL | Status: DC
  Administered 2019-05-04: 20 mg via ORAL
  Filled 2019-05-03: qty 1

## 2019-05-03 MED ORDER — BUPIVACAINE-EPINEPHRINE 0.5% -1:200000 IJ SOLN
INTRAMUSCULAR | Status: DC | PRN
  Administered 2019-05-03: 8 mL

## 2019-05-03 MED ORDER — PHENYLEPHRINE HCL (PRESSORS) 10 MG/ML IV SOLN
INTRAVENOUS | Status: DC | PRN
  Administered 2019-05-03: 50 ug via INTRAVENOUS
  Administered 2019-05-03 (×2): 100 ug via INTRAVENOUS

## 2019-05-03 MED ORDER — SODIUM CHLORIDE 0.9 % IR SOLN
Status: DC | PRN
  Administered 2019-05-03: 1000 mL

## 2019-05-03 MED ORDER — EPHEDRINE SULFATE 50 MG/ML IJ SOLN
INTRAMUSCULAR | Status: DC | PRN
  Administered 2019-05-03: 10 mg via INTRAVENOUS
  Administered 2019-05-03: 5 mg via INTRAVENOUS
  Administered 2019-05-03: 10 mg via INTRAVENOUS
  Administered 2019-05-03: 5 mg via INTRAVENOUS

## 2019-05-03 MED ORDER — LIDOCAINE HCL (CARDIAC) PF 100 MG/5ML IV SOSY
PREFILLED_SYRINGE | INTRAVENOUS | Status: DC | PRN
  Administered 2019-05-03: 100 mg via INTRAVENOUS

## 2019-05-03 MED ORDER — FENTANYL CITRATE (PF) 100 MCG/2ML IJ SOLN
25.0000 ug | INTRAMUSCULAR | Status: DC | PRN
  Administered 2019-05-03: 50 ug via INTRAVENOUS

## 2019-05-03 MED ORDER — FAMOTIDINE 20 MG PO TABS
20.0000 mg | ORAL_TABLET | Freq: Every day | ORAL | Status: DC
  Administered 2019-05-04 (×2): 20 mg via ORAL
  Filled 2019-05-03 (×2): qty 1

## 2019-05-03 MED ORDER — SODIUM CHLORIDE 0.9% FLUSH: 3.0000 mL | INTRAVENOUS | Status: DC | PRN

## 2019-05-03 MED ORDER — OXYCODONE HCL 5 MG PO TABS
5.0000 mg | ORAL_TABLET | ORAL | Status: DC | PRN
  Administered 2019-05-04 – 2019-05-05 (×3): 5 mg via ORAL
  Filled 2019-05-03 (×2): qty 1

## 2019-05-03 MED ORDER — CEFAZOLIN SODIUM-DEXTROSE 2-4 GM/100ML-% IV SOLN
2.0000 g | Freq: Once | INTRAVENOUS | Status: AC
  Administered 2019-05-03: 2 g via INTRAVENOUS

## 2019-05-03 MED ORDER — OXYCODONE HCL 5 MG PO TABS: 5.0000 mg | ORAL_TABLET | Freq: Once | ORAL | Status: DC | PRN

## 2019-05-03 MED ORDER — SENNA 8.6 MG PO TABS
1.0000 | ORAL_TABLET | Freq: Two times a day (BID) | ORAL | Status: DC
  Administered 2019-05-04 – 2019-05-05 (×3): 8.6 mg via ORAL
  Filled 2019-05-03 (×3): qty 1

## 2019-05-03 MED ORDER — MENTHOL 3 MG MT LOZG
1.0000 | LOZENGE | OROMUCOSAL | Status: DC | PRN
  Filled 2019-05-03: qty 9

## 2019-05-03 MED ORDER — METHOCARBAMOL 1000 MG/10ML IJ SOLN
500.0000 mg | Freq: Four times a day (QID) | INTRAVENOUS | Status: DC
  Administered 2019-05-03: 500 mg via INTRAVENOUS
  Filled 2019-05-03 (×8): qty 5

## 2019-05-03 MED ORDER — GLYCOPYRROLATE 0.2 MG/ML IJ SOLN
INTRAMUSCULAR | Status: AC
  Administered 2019-05-03: 0.2 mg via INTRAVENOUS
  Filled 2019-05-03: qty 1

## 2019-05-03 MED ORDER — EPHEDRINE 5 MG/ML INJ
INTRAVENOUS | Status: AC
  Filled 2019-05-03: qty 10

## 2019-05-03 MED ORDER — MIDAZOLAM HCL 2 MG/2ML IJ SOLN
INTRAMUSCULAR | Status: AC
  Filled 2019-05-03: qty 2

## 2019-05-03 MED ORDER — LISINOPRIL 20 MG PO TABS
30.0000 mg | ORAL_TABLET | Freq: Every day | ORAL | Status: DC
  Administered 2019-05-04 (×2): 30 mg via ORAL
  Filled 2019-05-03: qty 1

## 2019-05-03 MED ORDER — POLYETHYLENE GLYCOL 3350 17 G PO PACK
17.0000 g | PACK | Freq: Every day | ORAL | Status: DC | PRN
  Administered 2019-05-05: 17 g via ORAL
  Filled 2019-05-03: qty 1

## 2019-05-03 MED ORDER — SODIUM CHLORIDE 0.9% FLUSH
3.0000 mL | Freq: Two times a day (BID) | INTRAVENOUS | Status: DC
  Administered 2019-05-04 (×2): 3 mL via INTRAVENOUS

## 2019-05-03 MED ORDER — ONDANSETRON HCL 4 MG/2ML IJ SOLN
INTRAMUSCULAR | Status: DC | PRN
  Administered 2019-05-03: 4 mg via INTRAVENOUS

## 2019-05-03 SURGICAL SUPPLY — 92 items
BULB RESERV EVAC DRAIN JP 100C (MISCELLANEOUS) ×2 IMPLANT
BUR NEURO DRILL SOFT 3.0X3.8M (BURR) ×3 IMPLANT
CAGE MODULUS XL 10X18X55 - 10 (Cage) ×6 IMPLANT
CANISTER SUCT 1200ML W/VALVE (MISCELLANEOUS) ×6 IMPLANT
CHLORAPREP W/TINT 26 (MISCELLANEOUS) ×12 IMPLANT
CORD BIP STRL DISP 12FT (MISCELLANEOUS) ×3 IMPLANT
COUNTER NEEDLE 20/40 LG (NEEDLE) ×3 IMPLANT
COVER BACK TABLE REUSABLE LG (DRAPES) ×3 IMPLANT
COVER LIGHT HANDLE STERIS (MISCELLANEOUS) ×12 IMPLANT
COVER WAND RF STERILE (DRAPES) ×3 IMPLANT
CRADLE LAMINECT ARM (MISCELLANEOUS) ×6 IMPLANT
CUP MEDICINE 2OZ PLAST GRAD ST (MISCELLANEOUS) ×3 IMPLANT
DERMABOND ADVANCED (GAUZE/BANDAGES/DRESSINGS) ×4
DERMABOND ADVANCED .7 DNX12 (GAUZE/BANDAGES/DRESSINGS) ×2 IMPLANT
DRAIN CHANNEL JP 10F RND 20C F (MISCELLANEOUS) ×2 IMPLANT
DRAPE C-ARM 42X72 X-RAY (DRAPES) ×10 IMPLANT
DRAPE C-ARMOR (DRAPES) ×6 IMPLANT
DRAPE INCISE IOBAN 66X45 STRL (DRAPES) ×6 IMPLANT
DRAPE LAPAROTOMY 100X77 ABD (DRAPES) ×6 IMPLANT
DRAPE MICROSCOPE SPINE 48X150 (DRAPES) ×1 IMPLANT
DRAPE POUCH INSTRU U-SHP 10X18 (DRAPES) ×3 IMPLANT
DRAPE SURG 17X11 SM STRL (DRAPES) ×24 IMPLANT
DRSG OPSITE POSTOP 4X6 (GAUZE/BANDAGES/DRESSINGS) IMPLANT
DRSG TEGADERM 2-3/8X2-3/4 SM (GAUZE/BANDAGES/DRESSINGS) ×4 IMPLANT
DRSG TEGADERM 4X4.75 (GAUZE/BANDAGES/DRESSINGS) ×4 IMPLANT
DRSG TEGADERM 6X8 (GAUZE/BANDAGES/DRESSINGS) ×2 IMPLANT
DRSG TELFA 3X8 NADH (GAUZE/BANDAGES/DRESSINGS) ×6 IMPLANT
DRSG TELFA 4X3 1S NADH ST (GAUZE/BANDAGES/DRESSINGS) ×2 IMPLANT
ELECT CAUTERY BLADE TIP 2.5 (TIP) ×6
ELECT EZSTD 165MM 6.5IN (MISCELLANEOUS) ×3
ELECT REM PT RETURN 9FT ADLT (ELECTROSURGICAL) ×6
ELECTRODE CAUTERY BLDE TIP 2.5 (TIP) ×2 IMPLANT
ELECTRODE EZSTD 165MM 6.5IN (MISCELLANEOUS) ×1 IMPLANT
ELECTRODE REM PT RTRN 9FT ADLT (ELECTROSURGICAL) ×2 IMPLANT
FEE INTRAOP MONITOR IMPULS NCS (MISCELLANEOUS) IMPLANT
FRAME EYE SHIELD (PROTECTIVE WEAR) ×6 IMPLANT
GLOVE BIOGEL PI IND STRL 7.0 (GLOVE) ×2 IMPLANT
GLOVE BIOGEL PI INDICATOR 7.0 (GLOVE) ×4
GLOVE SURG SYN 7.0 (GLOVE) ×12 IMPLANT
GLOVE SURG SYN 7.0 PF PI (GLOVE) ×4 IMPLANT
GLOVE SURG SYN 8.5  E (GLOVE) ×12
GLOVE SURG SYN 8.5 E (GLOVE) ×6 IMPLANT
GLOVE SURG SYN 8.5 PF PI (GLOVE) ×6 IMPLANT
GOWN SRG XL LVL 3 NONREINFORCE (GOWNS) ×2 IMPLANT
GOWN STRL NON-REIN TWL XL LVL3 (GOWNS) ×4
GOWN STRL REUS W/TWL MED LVL3 (GOWN DISPOSABLE) ×6 IMPLANT
GRADUATE 1200CC STRL 31836 (MISCELLANEOUS) ×3 IMPLANT
GUIDEWIRE NITINOL BEVEL TIP (WIRE) ×16 IMPLANT
HEMOVAC 400CC 10FR (MISCELLANEOUS) ×2 IMPLANT
INTRAOP MONITOR FEE IMPULS NCS (MISCELLANEOUS)
INTRAOP MONITOR FEE IMPULSE (MISCELLANEOUS)
KIT DILATOR XLIF 5 (KITS) ×1 IMPLANT
KIT NDL NVM5 EMG ELECT (KITS) IMPLANT
KIT NEEDLE NVM5 EMG ELECT (KITS) ×2 IMPLANT
KIT NEEDLE NVM5 EMG ELECTRODE (KITS) ×1
KIT SPINAL PRONEVIEW (KITS) ×3 IMPLANT
KIT SURGICAL ACCESS MAXCESS 4 (KITS) ×2 IMPLANT
KIT TURNOVER KIT A (KITS) ×3 IMPLANT
KIT XLIF (KITS) ×1
KNIFE BAYONET SHORT DISCETOMY (MISCELLANEOUS) IMPLANT
MARKER SKIN DUAL TIP RULER LAB (MISCELLANEOUS) ×9 IMPLANT
NDL I PASS (NEEDLE) IMPLANT
NDL SAFETY ECLIPSE 18X1.5 (NEEDLE) ×1 IMPLANT
NEEDLE HYPO 18GX1.5 SHARP (NEEDLE) ×2
NEEDLE HYPO 22GX1.5 SAFETY (NEEDLE) ×3 IMPLANT
NEEDLE I PASS (NEEDLE) ×3 IMPLANT
PACK LAMINECTOMY NEURO (CUSTOM PROCEDURE TRAY) ×3 IMPLANT
PAD ARMBOARD 7.5X6 YLW CONV (MISCELLANEOUS) ×3 IMPLANT
PAD DRESSING TELFA 3X8 NADH (GAUZE/BANDAGES/DRESSINGS) IMPLANT
PAD TELFA 2X3 NADH STRL (GAUZE/BANDAGES/DRESSINGS) ×4 IMPLANT
PENCIL ELECTRO HAND CTR (MISCELLANEOUS) ×3 IMPLANT
PUTTY DBM PROPEL MEDIUM (Putty) ×1 IMPLANT
PUTTY PROPEL MEDIUM (Putty) ×1 IMPLANT
ROD RELINE MAS LORD 5.5X100MM (Rod) ×4 IMPLANT
SCREW LOCK RELINE 5.5 TULIP (Screw) ×14 IMPLANT
SCREW RELINE RED 6.5X45MM POLY (Screw) ×14 IMPLANT
SPOGE SURGIFLO 8M (HEMOSTASIS) ×2
SPONGE GAUZE 2X2 8PLY STER LF (GAUZE/BANDAGES/DRESSINGS)
SPONGE GAUZE 2X2 8PLY STRL LF (GAUZE/BANDAGES/DRESSINGS) IMPLANT
SPONGE SURGIFLO 8M (HEMOSTASIS) ×1 IMPLANT
STAPLER SKIN PROX 35W (STAPLE) ×2 IMPLANT
SUT DVC VLOC 3-0 CL 6 P-12 (SUTURE) ×9 IMPLANT
SUT ETHILON 3-0 FS-10 30 BLK (SUTURE) ×6
SUT VIC AB 0 CT1 27 (SUTURE) ×8
SUT VIC AB 0 CT1 27XCR 8 STRN (SUTURE) ×1 IMPLANT
SUT VIC AB 2-0 CT1 18 (SUTURE) ×7 IMPLANT
SUTURE EHLN 3-0 FS-10 30 BLK (SUTURE) IMPLANT
SYR 30ML LL (SYRINGE) ×6 IMPLANT
TOWEL OR 17X26 4PK STRL BLUE (TOWEL DISPOSABLE) ×9 IMPLANT
TRAY FOLEY MTR SLVR 16FR STAT (SET/KITS/TRAYS/PACK) ×2 IMPLANT
TUBING CONNECTING 10 (TUBING) ×6 IMPLANT
TUBING CONNECTING 10' (TUBING) ×3

## 2019-05-03 NOTE — Transfer of Care (Signed)
Immediate Anesthesia Transfer of Care Note  Patient: Frederick Hood  Procedure(s) Performed: L2-5 XLIF, L2-5 POSTERIOR FUSION (N/A )  Patient Location: PACU  Anesthesia Type:General  Level of Consciousness: drowsy and patient cooperative  Airway & Oxygen Therapy: Patient Spontanous Breathing and Patient connected to face mask oxygen  Post-op Assessment: Report given to RN and Post -op Vital signs reviewed and stable  Post vital signs: Reviewed and stable  Last Vitals:  Vitals Value Taken Time  BP 148/77 05/03/19 1838  Temp    Pulse 48 05/03/19 1843  Resp 13 05/03/19 1843  SpO2 100 % 05/03/19 1843  Vitals shown include unvalidated device data.  Last Pain:  Vitals:   05/03/19 1838  TempSrc:   PainSc: 0-No pain         Complications: No apparent anesthesia complications

## 2019-05-03 NOTE — Anesthesia Procedure Notes (Signed)
Procedure Name: Intubation Date/Time: 05/03/2019 1:38 PM Performed by: Hedda Slade, CRNA Pre-anesthesia Checklist: Patient identified, Patient being monitored, Timeout performed, Emergency Drugs available and Suction available Patient Re-evaluated:Patient Re-evaluated prior to induction Oxygen Delivery Method: Circle system utilized Preoxygenation: Pre-oxygenation with 100% oxygen Induction Type: IV induction Ventilation: Mask ventilation without difficulty and Oral airway inserted - appropriate to patient size Laryngoscope Size: Mac and 4 Grade View: Grade I Tube type: Oral Tube size: 7.5 mm Number of attempts: 1 Airway Equipment and Method: Stylet Placement Confirmation: ETT inserted through vocal cords under direct vision,  positive ETCO2 and breath sounds checked- equal and bilateral Secured at: 21 cm Tube secured with: Tape Dental Injury: Teeth and Oropharynx as per pre-operative assessment

## 2019-05-03 NOTE — Anesthesia Preprocedure Evaluation (Signed)
Anesthesia Evaluation  Patient identified by MRN, date of birth, ID band Patient awake    Reviewed: Allergy & Precautions, NPO status , Patient's Chart, lab work & pertinent test results  History of Anesthesia Complications Negative for: history of anesthetic complications  Airway Mallampati: II  TM Distance: >3 FB Neck ROM: Full    Dental no notable dental hx.    Pulmonary neg pulmonary ROS, neg sleep apnea, neg COPD,    breath sounds clear to auscultation- rhonchi (-) wheezing      Cardiovascular hypertension, Pt. on medications (-) CAD, (-) Past MI, (-) Cardiac Stents and (-) CABG  Rhythm:Regular Rate:Normal - Systolic murmurs and - Diastolic murmurs    Neuro/Psych PSYCHIATRIC DISORDERS Anxiety Depression TIA   GI/Hepatic Neg liver ROS, GERD  ,  Endo/Other  negative endocrine ROSneg diabetes  Renal/GU negative Renal ROS     Musculoskeletal negative musculoskeletal ROS (+)   Abdominal (+) - obese,   Peds  Hematology negative hematology ROS (+)   Anesthesia Other Findings Past Medical History: No date: Anxiety No date: Depression No date: GERD (gastroesophageal reflux disease) No date: Hypertension No date: TIA (transient ischemic attack)     Comment:  2019   Reproductive/Obstetrics                             Anesthesia Physical Anesthesia Plan  ASA: III  Anesthesia Plan: General   Post-op Pain Management:    Induction: Intravenous  PONV Risk Score and Plan: 1 and Ondansetron and Dexamethasone  Airway Management Planned: Oral ETT  Additional Equipment:   Intra-op Plan:   Post-operative Plan: Extubation in OR  Informed Consent: I have reviewed the patients History and Physical, chart, labs and discussed the procedure including the risks, benefits and alternatives for the proposed anesthesia with the patient or authorized representative who has indicated his/her  understanding and acceptance.     Dental advisory given  Plan Discussed with: CRNA and Anesthesiologist  Anesthesia Plan Comments:         Anesthesia Quick Evaluation

## 2019-05-03 NOTE — H&P (Signed)
I have reviewed and confirmed my history and physical from 04/19/19 with no additions or changes. Plan for L2-5 XLIF/PSF.  Risks and benefits reviewed.  Heart sounds normal no MRG. Chest Clear to Auscultation Bilaterally.

## 2019-05-03 NOTE — Consult Note (Signed)
Pharmacy Antibiotic Note  Frederick Hood is a 75 y.o. male admitted on 05/03/2019 with surgical prophylaxis.  Pharmacy has been consulted for Vancomycin/Cefazolin dosing.  Plan: 1. Vancomycin 15 mg/kg x1 (round to the nearest 250 mg dose which is 1250 mg) started within 60 to 120 minutes prior to initial surgical incision.    2. Cefazolin 2 g x1 for patients <120 kg  No data recorded.  Recent Labs  Lab 05/01/19 0932  WBC 6.7  CREATININE 1.03    Estimated Creatinine Clearance: 72.2 mL/min (by C-G formula based on SCr of 1.03 mg/dL).    No Known Allergies Thank you for allowing pharmacy to be a part of this patient's care.  Katha Cabal 05/03/2019 12:27 PM

## 2019-05-03 NOTE — Op Note (Signed)
Indications: Mr. Kuhnert is a 74 yo male with acquired scoliosis and lumbar spondylolisthesis.  He failed conservative management and opted for surgical management  Findings: partial correction of deformity  Preoperative Diagnosis: M43.16, M41.90 Postoperative Diagnosis: same   EBL: 200 ml IVF: 1000 ml Drains: 2 placed posteriorly Disposition: Extubated and Stable to PACU Complications: none  No foley catheter was placed.   Preoperative Note:   Risks of surgery discussed include: infection, bleeding, stroke, coma, death, paralysis, CSF leak, nerve/spinal cord injury, numbness, tingling, weakness, complex regional pain syndrome, recurrent stenosis and/or disc herniation, vascular injury, development of instability, neck/back pain, need for further surgery, persistent symptoms, development of deformity, and the risks of anesthesia. The patient understood these risks and agreed to proceed.  NAME OF ANTERIOR PROCEDURE:               1. Anterior lumbar interbody fusion via a left lateral retroperitoneal approach at L2/3, L3/4, and L4/5 2. Placement of a Lordotic Modulus  [id number] interbody cage, filled with Demineralized Bone Matrix at L2/3 3.  Placement of a Lordotic Modulus  [id number] interbody cage, filled with Demineralized Bone Matrix at L3/4 4.  Placement of a Lordotic Modulus  [id number] interbody cage, filled with Demineralized Bone Matrix at L4/5   NAME OF POSTERIOR PROCEDURE: 1. Posterior instrumentation using   Nuvasive Reline Instrumentation 2. Posterolateral fusion, L2-5      PROCEDURE:  Patient was brought to the operating room, intubated, turned to the lateral position.  All pressure points were checked and double-checked.  The patient was prepped and draped in the standard fashion. Prior to prepping, fluoroscopy was brought in and the patient was positioned with a large bump under the contralateral side between the iliac crest and rib cage, allowing the area between the iliac  crest and the lateral aspect of the rib cage to open and increase the ability to reach inferiorly, to facilitate entry into the disc space.  The incision was marked upon the skin both the location of the disc space as well as the superior most aspect of the iliac crest.  Based on the identification of the disc space an incision was prepared, marked upon the skin and eventually was used for our lateral incision.  The fluoroscopy was turned into a cross table A/P image in order to confirm that the patient's spine remained in a perpendicular trajectory to the floor without rotation.  Once confirming that all the pressure points were checked and double-checked and the patient remained in sturdy position strapped down in this slightly jack-knifed lateral position, the patient was prepped and draped in standard fashion.  The skin was injected with local anesthetic, then incised until the abdominal wall fascia was noted.  I bluntly dissected posteriorly until we were able to identify the posterior musculature near petit's triangle.  At this point, using primarily blunt dissection with our finger aided with a metzenbaum scissor, were able to enter the retroperitoneal cavity.  The retroperitoneal potential space was opened further until palpating out the psoas muscle, the medial aspect of the iliac crest, the medial aspect of the last rib and continued to define the retroperitoneal space with blunt dissection in order to facilitate safe placement of our dilators.    While protecting by dissecting directly onto a finger in the retroperitoneum, the retroperitoneal space was entered safely from the lateral incision and the initial dilator placed onto the muscle belly of the psoas.  While directly stimulating the dilator and after radiographically confirming our  location relative to the disc space, I placed the dilator through the psoas.  The dilators were stimulated to ensure remaining safely away from any of the lumbar  plexus nerves; the dilators were repositioned until no pathologic stimulation was appreciated.  Once I had confirmed the location of our initial dilator radiographically, a K-wire secured the dilator into the L4/5 disc space and confirmed position under A/P and lateral fluoroscopy.  At this point, I dilated up with direct stimulation to confirm lack of pathologic stimulation.  Once all the dilators were in position, I placed in the retractor and secured it onto the table, locked into position and confirmed under A/P and lateral fluoroscopy to confirm our approach angle to the disc space as well as location relative to the disc space.  I then placed the muscle stimulator in through the working channel down to the vertebral body, stimulating the entire lateral surface of the vertebral body and any of the visualized psoas muscle that was adjacent to the retractor, confirming again the safe passage to the psoas before we began performing the discectomy.  At this point, we began our discectomy at L4/5.  The disc was incised laterally throughout the extent of our exposure. Using a combination of pituitary rongeurs, Kerrison rongeurs, rasps, curettes of various sorts, we were able to begin to clean out the disc space.  Once we had cleaned out the majority of the disc space, we then cut the lateral annulus with a cob, breaking the lateral annual attachments on the contralateral side by subtly working the cob through the annulus while using flouroscopy.  Care was taken not to extend further than required after cutting the annular attachments.  After this had been performed, we prepared the endplates for placement of our graft, sized a graft to the disc space by serially dilating up in trial sizes until we confirmed that our graft would be well positioned, allowing distraction while maintaining good grip.  This was confirmed under A/P and lateral fluoroscopy in order to ensure its placement as an eventual trial for  placement of our final graft.  We irrigated with bacteriostatic saline.  Once confirmed placement, the Modulus implant filled with allograft was impacted into position at L4/5.   Through a combination of intradiscal distraction and anterior releasing, we were able to correct the anterior deformity during disc preparation and placement of the graft.  After performing the lateral lumbar interbody procedure at L4/5, attention was moved to the L3/4 level.   While protecting by dissecting directly onto a finger in the retroperitoneum, the retroperitoneal space was entered safely from the lateral incision and the initial dilator placed onto the muscle belly of the psoas.  While directly stimulating the dilator and after radiographically confirming our location relative to the disc space, I placed the dilator through the psoas.  The dilators were stimulated to ensure remaining safely away from any of the lumbar plexus nerves; the dilators were repositioned until no pathologic stimulation was appreciated.  Once I had confirmed the location of our initial dilator radiographically, a K-wire secured the dilator into the L3/4 disc space and confirmed position under A/P and lateral fluoroscopy.  At this point, I dilated up with direct stimulation to confirm lack of pathologic stimulation.  Once all the dilators were in position, I placed in the retractor (while stimulating the posterior blade) and this was secured onto the table, locked into position and confirmed under A/P and lateral fluoroscopy to confirm our approach angle to the  disc space as well as location relative to the disc space.  I then placed the muscle stimulator in through the working channel down to the vertebral body, stimulating the entire lateral surface of the vertebral body and any of the visualized psoas muscle that was adjacent to the retractor, confirming again the safe passage to the psoas before we began performing the discectomy.  At this  point, we began our discectomy at L3/4.  Through a combination of intradiscal distraction and anterior releasing, we were able to correct the anterior deformity.  The disc was incised laterally throughout the extent of our exposure. Using a combination of pituitary rongeurs, Kerrison rongeurs, rasps, curettes of various sorts, we were able to begin to clean out the disc space.  Once we had cleaned out the majority of the disc space, we then cut the lateral annulus with a cob, breaking the lateral annual attachments on the contralateral side by subtly working the cob through the annulus while using flouroscopy.  Care was taken not to extend further than required after cutting the annular attachments.  After this had been performed, we prepared the endplates for placement of our graft, sized a graft to the disc space by serially dilating up in trial sizes until we confirmed that our graft would be well positioned, allowing distraction while maintaining good grip.  This was confirmed under A/P and lateral fluoroscopy in order to ensure its placement as an eventual trial for placement of our final graft.  We irrigated with bacteriostatic saline.  Once confirmed placement, the Modulus graft was impacted into position at L3/4.          After performing the lateral lumbar interbody procedure at L3/4, attention was moved to the L2/3 level.   While protecting by dissecting directly onto a finger in the retroperitoneum, the retroperitoneal space was entered safely from the lateral incision and the initial dilator placed onto the muscle belly of the psoas.  While directly stimulating the dilator and after radiographically confirming our location relative to the disc space, I placed the dilator through the psoas.  The dilators were stimulated to ensure remaining safely away from any of the lumbar plexus nerves; the dilators were repositioned until no pathologic stimulation was appreciated.  Once I had confirmed the  location of our initial dilator radiographically, a K-wire secured the dilator into the L2/3 disc space and confirmed position under A/P and lateral fluoroscopy.  At this point, I dilated up with direct stimulation to confirm lack of pathologic stimulation.  Once all the dilators were in position, I placed in the retractor (while stimulating the posterior blade) and this was secured onto the table, locked into position and confirmed under A/P and lateral fluoroscopy to confirm our approach angle to the disc space as well as location relative to the disc space.  I then placed the muscle stimulator in through the working channel down to the vertebral body, stimulating the entire lateral surface of the vertebral body and any of the visualized psoas muscle that was adjacent to the retractor, confirming again the safe passage to the psoas before we began performing the discectomy.  At this point, we began our discectomy at L2/3.  Through a combination of intradiscal distraction and anterior releasing, we were able to correct the anterior deformity.  The disc was incised laterally throughout the extent of our exposure. Using a combination of pituitary rongeurs, Kerrison rongeurs, rasps, curettes of various sorts, we were able to begin to clean out the  disc space.  Once we had cleaned out the majority of the disc space, we then cut the lateral annulus with a cob, breaking the lateral annual attachments on the contralateral side by subtly working the cob through the annulus while using flouroscopy.  Care was taken not to extend further than required after cutting the annular attachments.  After this had been performed, we prepared the endplates for placement of our graft, sized a graft to the disc space by serially dilating up in trial sizes until we confirmed that our graft would be well positioned, allowing distraction while maintaining good grip.  This was confirmed under A/P and lateral fluoroscopy in order to  ensure its placement as an eventual trial for placement of our final graft.  We irrigated with bacteriostatic saline.  Once confirmed placement, the Modulus graft was impacted into position at L2/3.               At this point, final radiographs were performed, and we began closure.  The wound was closed using 0 Vicryl interrupted suture in the fascia and 2-0 Vicryl inverted suture were placed in the subcutaneous tissue and dermis. 3-0 monocryl was used for final closure. Dermabond was used to close the skin.    After closing the anterior part in layers, the patient was repositioned into prone position.  All pressure points were checked and double-checked and we brought in fluoroscopy to confirm our approach angles for putting in percutaneous pedicle screws.  The pedicles were marked using true AP flouroscopy, adjusting the angle at each level.  We then prepped and draped the patient in the standard fashion.  At this point, incisions were made for placing percutaneous pedicle screw instrumentation at L2-5.  Starting at L2, a Jamsheedi needle was used to cannulate the pedicle bilaterally using AP flouroscopy. Direct stimulation was used on the needle without any low (<15 mAmp) stimulation thresholds. After cannulation of the pedicle to 30 mm, a K-wire was placed through the [place] approximately and secured.   Using a similar technique, the pedicles at L2-L5 were cannulated and K wires secured. The K wires were then checked using lateral flouroscopy to ensure placement into the vertebral bodies. After confirming placement of K wires, cannulated pedicle screws were introduced over the K wires at each level.  After advancing each screw into the vertebral body approximately 25-30 mm, the K wire was removed.At each level, 6.5x81mm Nuvasive Reline pedicle screws were placed under lateral flouroscopy. Due to difficulty cannulating the L4 pedicle on the right because of dysplastic anatomy, this pedicle was  left without instrumentation on the right only. Once the screws were placed, the screw extensions were then linked, a path was formed for the rod and a rod was utilized to connect the screws.  We then compressed, torqued / counter-torqued and removed the screw assembly. Once performed on each side, confirmatory AP and lateral x-rays were taken and the case was completed.   Again we confirmed radiographically and began our closure.  A drain was placed on each side. The posterolateral elements were prepared for arthrodesis from L2-5, which was performed. The wound was closed using 0 Vicryl interrupted suture in the fascia, 2-0 Vicryl inverted suture were placed in the subcutaneous tissue and dermis. Staples were used on the skin.  Needle, lap and all counts were correct at the end of the case.      Ivar Drape PA assisted in the entire procedure.  Venetia Night MD Neurosurgery

## 2019-05-04 ENCOUNTER — Inpatient Hospital Stay: Payer: Medicare Other

## 2019-05-04 ENCOUNTER — Encounter: Payer: Self-pay | Admitting: Neurosurgery

## 2019-05-04 MED ORDER — METHOCARBAMOL 500 MG PO TABS
750.0000 mg | ORAL_TABLET | Freq: Four times a day (QID) | ORAL | Status: DC
  Administered 2019-05-04 – 2019-05-05 (×6): 750 mg via ORAL
  Filled 2019-05-04 (×6): qty 2

## 2019-05-04 MED ORDER — KETOROLAC TROMETHAMINE 30 MG/ML IJ SOLN
30.0000 mg | Freq: Once | INTRAMUSCULAR | Status: AC
  Administered 2019-05-04: 30 mg via INTRAVENOUS
  Filled 2019-05-04: qty 1

## 2019-05-04 MED ORDER — CITALOPRAM HYDROBROMIDE 20 MG PO TABS
20.0000 mg | ORAL_TABLET | Freq: Every day | ORAL | Status: DC
  Administered 2019-05-05: 20 mg via ORAL
  Filled 2019-05-04: qty 1

## 2019-05-04 MED ORDER — METHOCARBAMOL 1000 MG/10ML IJ SOLN
500.0000 mg | Freq: Four times a day (QID) | INTRAVENOUS | Status: DC
  Filled 2019-05-04: qty 5

## 2019-05-04 MED ORDER — KETOROLAC TROMETHAMINE 15 MG/ML IJ SOLN: 15.0000 mg | Freq: Four times a day (QID) | INTRAMUSCULAR | Status: DC

## 2019-05-04 MED ORDER — KETOROLAC TROMETHAMINE 15 MG/ML IJ SOLN
15.0000 mg | Freq: Four times a day (QID) | INTRAMUSCULAR | Status: AC
  Administered 2019-05-04 – 2019-05-05 (×5): 15 mg via INTRAVENOUS
  Filled 2019-05-04 (×5): qty 1

## 2019-05-04 NOTE — Progress Notes (Signed)
Procedure: L2-5 XLIF, L2-5 posterior fusion Procedure date: 05/03/2019 Diagnosis: Neurogenic claudication   History: Frederick Hood is s/p L2-L5 XLIF, L2-5 posterior fusion  POD1: He is recovering well.  Complains of back pain 8-9/10.  He did not sleep well last night.  He has not been up and walking yet but stood up for x-ray and states some difficulty with that.  Catheter was removed this morning; he has not voided yet.  Denies any new lower extremity pain/numbness/tingling/weakness. Drain output right: 10 Drain output left: 80  Physical Exam: Vitals:   05/04/19 0401 05/04/19 0725  BP: (!) 148/75 (!) 143/75  Pulse: 63 67  Resp: 16 18  Temp: 98.7 F (37.1 C) 98.6 F (37 C)  SpO2: 100% 100%    General: Alert and oriented,  sitting in hospital bed sitting in hospital bed Strength:5/5 throughout lower extremities except right dorsiflexion and EHL 2/5 (states this is his baseline) Sensation: intact and symmetric throughout lower extremities Skin: Minimal drainage on lumbar dressing.  Glue intact at incision sites.  No active bleeding noted at drain sites.  Data:  Recent Labs  Lab 05/01/19 0932  NA 140  K 3.9  CL 106  CO2 27  BUN 17  CREATININE 1.03  GLUCOSE 86  CALCIUM 9.1   No results for input(s): AST, ALT, ALKPHOS in the last 168 hours.  Invalid input(s): TBILI   Recent Labs  Lab 05/01/19 0932  WBC 6.7  HGB 13.0  HCT 39.5  PLT 182   Recent Labs  Lab 05/01/19 0932  APTT 35  INR 1.0         Other tests/results:  Lumbar x-rays completed.  Awaiting formal radiology report.  Assessment/Plan:  Frederick Hood is POD1 s/p L2-L5 XLIF. Will continue to monitor  - monitor drain output - mobilize - pain control -add Toradol 30 mg single dose followed by 15 mg every 6 hours for 5 additional doses. - DVT prophylaxis - PTOT - Brace - Imaging -completed   Ivar Drape PA-C Department of Neurosurgery

## 2019-05-04 NOTE — Evaluation (Signed)
Occupational Therapy Evaluation Patient Details Name: Frederick Hood MRN: [id number] DOB: 10/10/1945 Today's Date: 05/04/2019    History of Present Illness 74 yo male s/p lumbar fusion on 05/03/19. PMH includes HTN, anxiety, depression, TIA   Clinical Impression   Patient seen this date for OT evaluation.  Pt seated in recliner and agreeable to session.  Previously, pt was I with all (B/I)ADLs and functional mobility.  Patient works on a ranch dealing with horses and cattle.  Provided education to patient on role and goals of OT in acute setting and regarding pt's diagnosis. Provided further education on back precautions, use of adaptive equipment, compensatory techniques and energy conservation techniques to safely perform ADLs and functional transfers.  Patient demonstrated and verbalized understanding.  Completed sit to stand from recliner with CGA/MIN A due to pain.  Demonstrates good carryover of body mechanics to maintain back precautions.  The patient would benefit from continued OT services to address activity tolerance, standing tolerance, strengthening, ADL retraining, AE training, and compensatory techniqes.  Based on today's performance, recommending HH OT and assistance for work if able to obtain.      Follow Up Recommendations  Home health OT    Equipment Recommendations  None recommended by OT    Recommendations for Other Services       Precautions / Restrictions Precautions Precautions: Fall;Back Precaution Comments: Patient may don LSO while seated. May take off brace while supine in bed, showering or for urgent bathroom needs. Required Braces or Orthoses: Spinal Brace Spinal Brace: Lumbar corset Restrictions Weight Bearing Restrictions: No Other Position/Activity Restrictions: No bending, lifting, twisting.      Mobility Bed Mobility Overal bed mobility: Needs Assistance Bed Mobility: Rolling;Sidelying to Sit Rolling: Min guard Sidelying to sit: Min guard;HOB  elevated       General bed mobility comments: No assessed.  Pt sitting in recliner upon entry.  Transfers Overall transfer level: Needs assistance Equipment used: Rolling walker (2 wheeled) Transfers: Sit to/from Stand Sit to Stand: Min guard;Min assist Stand pivot transfers: Min guard       General transfer comment: Required 2 trials d/t pain.  Education/cues on sequencing and body mechanics to maintain precautions.  Demonstrated good carryover.    Balance Overall balance assessment: Mild deficits observed, not formally tested                                         ADL either performed or assessed with clinical judgement   ADL Overall ADL's : Needs assistance/impaired     Grooming: Wash/dry hands;Sitting;Set up     Upper Body Bathing Details (indicate cue type and reason): Educated on compensatory techniques, AE and precautions Lower Body Bathing: Maximal assistance;Sitting/lateral leans;Adhering to back precautions Lower Body Bathing Details (indicate cue type and reason): Educated on compensatory techniques and AE     Lower Body Dressing: Maximal assistance;Adhering to back precautions;Sit to/from stand Lower Body Dressing Details (indicate cue type and reason): Educated on use of AE and compensatory techniques Toilet Transfer: Immunologist Details (indicate cue type and reason): Requires extra time due to pain and lethargy         Functional mobility during ADLs: Min guard;Minimal assistance;Rolling walker;Cueing for sequencing;Cueing for safety General ADL Comments: patient demonstrates good self pacing.  REquires cues to maintain back precautions during movement.  Demonstrates good carryover with education.     Vision Patient  Visual Report: No change from baseline       Perception     Praxis      Pertinent Vitals/Pain Pain Assessment: 0-10 Pain Score: 3  Pain Location: Patient states 3/10 low back pain while sitting.   Noted increased grimacing when standing, but unable to rate. Pain Descriptors / Indicators: Aching;Sore Pain Intervention(s): Limited activity within patient's tolerance;Monitored during session;Repositioned     Hand Dominance Right   Extremity/Trunk Assessment Upper Extremity Assessment Upper Extremity Assessment: Generalized weakness(Educated on no lifting precautions)   Lower Extremity Assessment Lower Extremity Assessment: Defer to PT evaluation   Cervical / Trunk Assessment Cervical / Trunk Assessment: Normal   Communication Communication Communication: No difficulties   Cognition Arousal/Alertness: Awake/alert Behavior During Therapy: WFL for tasks assessed/performed Overall Cognitive Status: Within Functional Limits for tasks assessed                                 General Comments: A&Ox4.  Patient reports still feeling "foggy" after surgery.   General Comments  Generally lethargic.  Reports feeling "off" and "foggy" still from surgery and pain medication.    Exercises Other Exercises Other Exercises: Completed education on goals and role of OT in acute care setting Other Exercises: Provided education on back precautions during functional tasks (no BLT) Other Exercises: Education on wear time of LSO/precautions/donning/doffing Other Exercises: Extensive education on use of AE and compensatory techniques to perform LB dressing and bathing Other Exercises: Patient able to perform sit to stand from recliner with CGA/MIN A with education/cues on body mechanics to maintain precautions   Shoulder Instructions      Home Living Family/patient expects to be discharged to:: Private residence Living Arrangements: Alone Available Help at Discharge: Family;Available 24 hours/day;Friend(s)(Patient lives alone but states he can have assistance from family and friends.) Type of Home: House Home Access: Stairs to enter [employer] brewer of Steps: 3   Home  Layout: Multi-level;Able to live on main level with bedroom/bathroom     Bathroom Shower/Tub: Walk-in shower(built in seat and grab bars)   Bathroom Toilet: Handicapped height     Home Equipment: Shower seat;Grab bars - tub/shower;Cane - single point;Shower seat - built in          Prior Functioning/Environment Level of Independence: Independent        Comments: Patient states he was I with all (B/I)ADLs.  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  Pt states he needed more assistance lately due to pain.        OT Problem List: Decreased strength;Decreased activity tolerance;Decreased safety awareness;Decreased knowledge of precautions;Decreased knowledge of use of DME or AE      OT Treatment/Interventions: Self-care/ADL training;Therapeutic exercise;Energy conservation;DME and/or AE instruction;Therapeutic activities;Patient/family education    OT Goals(Current goals can be found in the care plan section) Acute Rehab OT Goals Patient Stated Goal: "feel more confident when I do what I need to do" OT Goal Formulation: With patient Time For Goal Achievement: 05/18/19 Potential to Achieve Goals: Good  OT Frequency: Min 2X/week   Barriers to D/C:            Co-evaluation              AM-PAC OT "6 Clicks" Daily Activity     Outcome Measure Help from another person eating meals?: None Help from another person taking care of personal grooming?: None Help from another person toileting, which includes using toliet,  bedpan, or urinal?: A Little Help from another person bathing (including washing, rinsing, drying)?: A Lot Help from another person to put on and taking off regular upper body clothing?: A Lot Help from another person to put on and taking off regular lower body clothing?: A Lot 6 Click Score: 17   End of Session Equipment Utilized During Treatment: Rolling walker;Gait belt  Activity Tolerance: Patient limited by pain;Patient tolerated treatment  well Patient left: in chair;with call bell/phone within reach;with chair alarm set  OT Visit Diagnosis: Unsteadiness on feet (R26.81);Muscle weakness (generalized) (M62.81)                Time: 8469-6295 OT Time Calculation (min): 20 min Charges:  OT General Charges $OT Visit: 1 Visit OT Evaluation $OT Eval Low Complexity: 1 Low OT Treatments $Self Care/Home Management : 8-22 mins  Louanne Belton, MS, OTR/L 05/04/19, 12:54 PM

## 2019-05-04 NOTE — Progress Notes (Signed)
Physical Therapy Treatment Patient Details Name: Frederick Hood MRN: [id number] DOB: 1945/04/07 Today's Date: 05/04/2019    History of Present Illness 74 yo male s/p lumbar fusion on 05/03/19. PMH includes HTN, anxiety, depression, TIA    PT Comments    Pt received sitting up in recliner and eager for PT. Pt reporting he has been up in recliner since morning session but got up to go to restroom, don underwear, wash face and brush teeth. Pt reporting pain at 4/10 with noticeable grimacing during STS. Pt able to recall 3/3 precautions without cuing. Pt required min guard assist for transfers and ambulation. Pt progressed to ambulating lap around nurses station with RW. Pt overall steady with slow and cautious gait pattern. Pt educated on continued use of Rw right now for improved stability with pt agreeing. Pt requesting to return to recliner with pt ending session in recliner with call bell and all needs met and in reach. 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 Pt will continue to benefit from acute PT for further mobility progression. Recommendation remains appropriate.     Follow Up Recommendations  Follow surgeon's recommendation for DC plan and follow-up therapies;Home health PT     Equipment Recommendations  Rolling walker with 5" wheels    Recommendations for Other Services       Precautions / Restrictions Precautions Precautions: Fall;Back Precaution Booklet Issued: No Precaution Comments: pt able to recall 3/3 precautions this session Required Braces or Orthoses: Spinal Brace Spinal Brace: Lumbar corset Restrictions Weight Bearing Restrictions: No Other Position/Activity Restrictions: No bending, lifting, twisting.    Mobility  Bed Mobility Overal bed mobility: Needs Assistance Bed Mobility: Rolling;Sidelying to Sit Rolling: Min guard Sidelying to sit: Min guard;HOB elevated        General bed mobility comments: deferred, pt up in recliner upon arrival and ended session in recliner  Transfers Overall transfer level: Needs assistance Equipment used: Rolling walker (2 wheeled) Transfers: Sit to/from Stand Sit to Stand: Min guard Stand pivot transfers: Min guard       General transfer comment: min guard for STS from recliner, good carryover with hand placement for RW  Ambulation/Gait Ambulation/Gait assistance: Min guard Gait Distance (Feet): 180 Feet Assistive device: Rolling walker (2 wheeled) Gait Pattern/deviations: Step-through pattern;Decreased stride length Gait velocity: dec   General Gait Details: pt ambulated lap around nurses station, increased reliance on BUE support from RW, overall steady with slow cautious gait pattern, required min cuing for keeping Rw closer and maintaining more upright posture, no buckling or LOB noted   Stairs             Wheelchair Mobility    Modified Rankin (Stroke Patients Only)       Balance Overall balance assessment: Mild deficits observed, not formally tested                                          Cognition Arousal/Alertness: Awake/alert Behavior During Therapy: WFL for tasks assessed/performed Overall Cognitive Status: Within Functional Limits for tasks assessed                                 General Comments: A&Ox4.  Patient reports still feeling "foggy" after surgery.  Exercises Other Exercises Other Exercises: Completed education on goals and role of OT in acute care setting Other Exercises: Provided education on back precautions during functional tasks (no BLT) Other Exercises: Education on wear time of LSO/precautions/donning/doffing Other Exercises: Extensive education on use of AE and compensatory techniques to perform LB dressing and bathing Other Exercises: Patient able to perform sit to stand from recliner with CGA/MIN A with education/cues on  body mechanics to maintain precautions    General Comments General comments (skin integrity, edema, etc.): Generally lethargic.  Reports feeling "off" and "foggy" still from surgery and pain medication.      Pertinent Vitals/Pain Pain Assessment: 0-10 Pain Score: 4  Pain Location: low back, increased grimacing during stand Pain Descriptors / Indicators: Aching;Sore;Operative site guarding;Grimacing Pain Intervention(s): Limited activity within patient's tolerance;Monitored during session;Repositioned    Home Living Family/patient expects to be discharged to:: Private residence Living Arrangements: Alone Available Help at Discharge: Family;Available 24 hours/day;Friend(s)(Patient lives alone but states he can have assistance from family and friends.) Type of Home: House Home Access: Stairs to enter   Home Layout: Multi-level;Able to live on main level with bedroom/bathroom Home Equipment: Shower seat;Grab bars - tub/shower;Cane - single point;Shower seat - built in      Prior Function Level of Independence: Independent      Comments: Patient states he was I with all (B/I)ADLs.  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  Pt states he needed more assistance lately due to pain.   PT Goals (current goals can now be found in the care plan section) Acute Rehab PT Goals Patient Stated Goal: "feel more confident when I do what I need to do" PT Goal Formulation: With patient Time For Goal Achievement: 05/18/19 Potential to Achieve Goals: Good Progress towards PT goals: Progressing toward goals    Frequency    BID      PT Plan Current plan remains appropriate    Co-evaluation              AM-PAC PT "6 Clicks" Mobility   Outcome Measure  Help needed turning from your back to your side while in a flat bed without using bedrails?: A Little Help needed moving from lying on your back to sitting on the side of a flat bed without using bedrails?: A Little Help needed  moving to and from a bed to a chair (including a wheelchair)?: A Little Help needed standing up from a chair using your arms (e.g., wheelchair or bedside chair)?: A Little Help needed to walk in hospital room?: A Little Help needed climbing 3-5 steps with a railing? : A Little 6 Click Score: 18    End of Session Equipment Utilized During Treatment: Gait belt Activity Tolerance: Patient tolerated treatment well Patient left: in chair;with call bell/phone within reach;with SCD's reapplied Nurse Communication: Mobility status PT Visit Diagnosis: Other abnormalities of gait and mobility (R26.89);Difficulty in walking, not elsewhere classified (R26.2)     Time: 4332-9518 PT Time Calculation (min) (ACUTE ONLY): 16 min  Charges:  $Therapeutic Activity: 8-22 mins                     Zachary George PT, DPT 1:58 PM,05/04/19 [phone]    Micaylah Bertucci Drucilla Chalet 05/04/2019, 1:54 PM

## 2019-05-04 NOTE — Plan of Care (Signed)
  Problem: Clinical Measurements: Goal: Ability to maintain clinical measurements within normal limits will improve Outcome: Progressing Goal: Will remain free from infection Outcome: Progressing Goal: Cardiovascular complication will be avoided Outcome: Progressing   Problem: Activity: Goal: Risk for activity intolerance will decrease Outcome: Progressing   Problem: Coping: Goal: Level of anxiety will decrease Outcome: Progressing   Problem: Elimination: Goal: Will not experience complications related to bowel motility Outcome: Progressing Goal: Will not experience complications related to urinary retention Outcome: Progressing   Problem: Pain Managment: Goal: General experience of comfort will improve Outcome: Progressing   Problem: Safety: Goal: Ability to remain free from injury will improve Outcome: Progressing

## 2019-05-04 NOTE — Progress Notes (Signed)
OT Cancellation Note  Patient Details Name: CONAL SHETLEY MRN: [id number] DOB: 1945-10-20   Cancelled Treatment:    Reason Eval/Treat Not Completed: Other (comment)  OT consult received and chart reviewed. Pt eating breakfast at this time and RN preparing to administer pain medication with pt reporting 9/10 pain. Will f/u as able when pt's pain is controlled. Thank you.  Rejeana Brock, MS, OTR/L ascom [phone] 05/04/19, 8:41 AM

## 2019-05-04 NOTE — Evaluation (Signed)
Physical Therapy Evaluation Patient Details Name: Frederick Hood MRN: [id number] DOB: 08-15-1945 Today's Date: 05/04/2019   History of Present Illness  74 yo male s/p lumbar fusion on 05/03/19. PMH includes HTN, anxiety, depression, TIA  Clinical Impression  Pt is a 74 yo male s/p lumbar fusion. Pt received in bed and eager to get up and get out of bed. Pt reports living alone in a multi level home where he stays on main level. Pt has a sister nearby and friends who are able to stay with him 24/7 as needed. 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 Pt reported pain at 4-5/10. Pt educated on back precautions and log roll technique for maintaining precautions during bed mobility. Pt required min guard assist for all functional mobility with cuing for technique, maintaining precautions and RW mgt. Pt educated on donning/doffing spinal brace with pt donning in sitting. Pt left in recliner with all needs met and in reach. Pt presents with dec strength, ROM, balance and activity tolerance consistent with recent surgery and increased pain and will benefit from further acute PT to improve. Recommendation for HHPT following acute hospitalization for continued mobility progression to allow for return to PLOF.      Follow Up Recommendations Follow surgeon's recommendation for DC plan and follow-up therapies;Home health PT    Equipment Recommendations  Rolling walker with 5" wheels    Recommendations for Other Services       Precautions / Restrictions Precautions Precautions: Fall;Back Required Braces or Orthoses: Spinal Brace Spinal Brace: Lumbar corset Restrictions Weight Bearing Restrictions: No      Mobility  Bed Mobility Overal bed mobility: Needs Assistance Bed Mobility: Rolling;Sidelying to Sit Rolling: Min guard Sidelying to sit: Min  guard;HOB elevated       General bed mobility comments: pt required vc/tc for log roll technique, no physical assist required to get EOB within precautions  Transfers Overall transfer level: Needs assistance Equipment used: Rolling walker (2 wheeled) Transfers: Sit to/from [facility] Sit to Stand: Min guard Stand pivot transfers: Min guard       General transfer comment: cuing required for hand placement with Rw and maintaining precautions, overall steady with STS and stand pivot to recliner, cuing required for RW mgt during stand pivot  Ambulation/Gait             General Gait Details: limited by iv line not on pole this session, nursing notified  Stairs            Wheelchair Mobility    Modified Rankin (Stroke Patients Only)       Balance Overall balance assessment: Mild deficits observed, not formally tested                                           Pertinent Vitals/Pain Pain Assessment: 0-10 Pain Score: 5  Pain Location: low back Pain Descriptors / Indicators: Aching;Sore;Operative site guarding Pain Intervention(s): Limited activity within patient's tolerance;Monitored during session;Repositioned    Home Living Family/patient expects to be discharged to:: Private residence Living Arrangements: Alone Available Help at Discharge: Family;Available 24 hours/day;Friend(s)(sister) Type of Home: House Home Access: Stairs to enter   [employer] of Steps: 3 Home Layout: Multi-level;Able to live on main level with  bedroom/bathroom Home Equipment: Shower seat;Grab bars - tub/shower;Cane - single point      Prior Function Level of Independence: Independent         Comments: pt reports being ind with all ADLs/IADLs, no recent falls, has a ranch with horses and cows and has been needing more assistance with ranch chores lately since nov     Hand Dominance        Extremity/Trunk Assessment   Upper  Extremity Assessment Upper Extremity Assessment: Defer to OT evaluation    Lower Extremity Assessment Lower Extremity Assessment: Generalized weakness       Communication   Communication: No difficulties  Cognition Arousal/Alertness: Awake/alert Behavior During Therapy: WFL for tasks assessed/performed Overall Cognitive Status: Within Functional Limits for tasks assessed                                        General Comments General comments (skin integrity, edema, etc.): hemovac and drain in place t/o session, both drained by nursing during session    Exercises Other Exercises Other Exercises: pt instructed on back precautions and maintaining during mobility Other Exercises: pt instructed on brace donning/doffing with pt donning in sitting, and when to wear brace   Assessment/Plan    PT Assessment Patient needs continued PT services  PT Problem List Decreased mobility;Decreased strength;Decreased range of motion;Decreased safety awareness;Decreased activity tolerance;Decreased balance;Pain;Decreased knowledge of use of DME;Decreased knowledge of precautions       PT Treatment Interventions DME instruction;Therapeutic exercise;Gait training;Balance training;Stair training;Neuromuscular re-education;Functional mobility training;Therapeutic activities;Patient/family education    PT Goals (Current goals can be found in the Care Plan section)  Acute Rehab PT Goals Patient Stated Goal: get more mobile and then go home PT Goal Formulation: With patient Time For Goal Achievement: 05/18/19 Potential to Achieve Goals: Good    Frequency BID   Barriers to discharge        Co-evaluation               AM-PAC PT "6 Clicks" Mobility  Outcome Measure Help needed turning from your back to your side while in a flat bed without using bedrails?: A Little Help needed moving from lying on your back to sitting on the side of a flat bed without using bedrails?: A  Little Help needed moving to and from a bed to a chair (including a wheelchair)?: A Little Help needed standing up from a chair using your arms (e.g., wheelchair or bedside chair)?: A Little Help needed to walk in hospital room?: A Little Help needed climbing 3-5 steps with a railing? : A Little 6 Click Score: 18    End of Session Equipment Utilized During Treatment: Gait belt Activity Tolerance: Patient tolerated treatment well Patient left: in chair;with call bell/phone within reach;with SCD's reapplied Nurse Communication: Mobility status PT Visit Diagnosis: Other abnormalities of gait and mobility (R26.89);Difficulty in walking, not elsewhere classified (R26.2)    Time: 1007-1040 PT Time Calculation (min) (ACUTE ONLY): 33 min   Charges:   PT Evaluation $PT Eval Moderate Complexity: 1 Mod PT Treatments $Therapeutic Activity: 8-22 mins        Zachary George PT, DPT 12:33 PM,05/04/19 [phone]   Brizeida Mcmurry Drucilla Chalet 05/04/2019, 12:27 PM

## 2019-05-05 MED ORDER — METHOCARBAMOL 500 MG PO TABS: 500.0000 mg | ORAL_TABLET | Freq: Four times a day (QID) | ORAL | 0 refills | Status: DC | PRN

## 2019-05-05 MED ORDER — OXYCODONE HCL 5 MG PO TABS: 5.0000 mg | ORAL_TABLET | ORAL | 0 refills | Status: DC | PRN

## 2019-05-05 MED ORDER — ACETAMINOPHEN 500 MG PO TABS: 500.0000 mg | ORAL_TABLET | Freq: Four times a day (QID) | ORAL | 0 refills | Status: AC | PRN

## 2019-05-05 MED ORDER — SENNA 8.6 MG PO TABS: 1.0000 | ORAL_TABLET | Freq: Every day | ORAL | 0 refills | Status: DC

## 2019-05-05 NOTE — Progress Notes (Signed)
Discharge instructions reviewed with patient. He verbalized understanding. IV removed. Stable condition. Has prescription for pain medication. Is now being wheeled out by NT.

## 2019-05-05 NOTE — TOC Transition Note (Signed)
Transition of Care [place]) - CM/SW Discharge Note   Patient Details  Name: KINGJAMES COURY MRN: [id number] Date of Birth: 10-22-1945  Transition of Care [facility]) CM/SW Contact:  Su Hilt, RN Phone Number: 05/05/2019, 11:36 AM   Clinical Narrative:     Met with the patient to discuss DC plan and needs, [place], his sister will help him at home, he is set up with [facility] for [facility] and they will continue He was provided a RW to the room by [employer] He will be transported this afternoon around 3 by his sister, he can afford his medicaitons and is up to date with pcp  Final next level of care: Home w Home Health Services Barriers to Discharge: Barriers Resolved   Patient Goals and CMS Choice Patient states their goals for this hospitalization and ongoing recovery are:: go home      Discharge Placement                       Discharge Plan and Services   Discharge Planning Services: CM Consult            DME Arranged: Gilford Rile rolling DME Agency: [employer] Date DME Agency Contacted: 05/05/19 Time DME Agency Contacted: 68 Representative spoke with at DME Agency: Leroy [place] HH Arranged: PT, OT [place] Date [place]: 05/05/19 Time [place]: [place] Representative spoke with at [place]: Cassie  Social Determinants of Health ([place]) Interventions     Readmission Risk Interventions No flowsheet data found.

## 2019-05-05 NOTE — Anesthesia Postprocedure Evaluation (Signed)
Anesthesia Post Note  Patient: Frederick Hood  Procedure(s) Performed: L2-5 XLIF, L2-5 POSTERIOR FUSION (N/A )  Anesthesia Type: General Level of consciousness: awake and alert and oriented Pain management: pain level controlled Vital Signs Assessment: post-procedure vital signs reviewed and stable Respiratory status: spontaneous breathing Cardiovascular status: blood pressure returned to baseline Anesthetic complications: no     Last Vitals:  Vitals:   05/05/19 0404 05/05/19 0732  BP: (!) 146/68 124/74  Pulse: 66 74  Resp:  17  Temp: 37.5 C 37.3 C  SpO2: 95% 95%    Last Pain:  Vitals:   05/05/19 0732  TempSrc: Oral  PainSc:                  Darbi Chandran

## 2019-05-05 NOTE — Discharge Summary (Addendum)
Procedure: L2-5 XLIF, L2-5 posterior fusion Procedure date: 05/03/2019 Diagnosis: Neurogenic claudication   History: Frederick Hood is s/p L2-L5 XLIF, L2-5 posterior fusion   POD2: He is doing well. Pain rated 4/10. Ambulating, eating, and voiding without issue. Continues to experience pain in lower extremities, but feels this has improved since surgery. Right ankle and great toe weakness remains but this has been present for quite some time.  Left and drain drain output each 30 ml overnight. No output over the last 12 hours.   POD1: He is recovering well.  Complains of back pain 8-9/10.  He did not sleep well last night.  He has not been up and walking yet but stood up for x-ray and states some difficulty with that.  Catheter was removed this morning; he has not voided yet.  Denies any new lower extremity pain/numbness/tingling/weakness. Drain output right: 10 Drain output left: 80  Physical Exam: Vitals:   05/05/19 0404 05/05/19 0732  BP: (!) 146/68 124/74  Pulse: 66 74  Resp:  17  Temp: 99.5 F (37.5 C) 99.1 F (37.3 C)  SpO2: 95% 95%    General: Alert and oriented, sitting in hospital bed Strength:5/5 throughout lower extremities except right dorsiflexion and EHL 2/5 (baseline) Sensation: intact and symmetric throughout lower extremities Skin: Minimal drainage on lumbar dressing.  Glue intact at incision sites.  No active bleeding noted at drain sites.  Data:  Recent Labs  Lab 05/01/19 0932  NA 140  K 3.9  CL 106  CO2 27  BUN 17  CREATININE 1.03  GLUCOSE 86  CALCIUM 9.1   No results for input(s): AST, ALT, ALKPHOS in the last 168 hours.  Invalid input(s): TBILI   Recent Labs  Lab 05/01/19 0932  WBC 6.7  HGB 13.0  HCT 39.5  PLT 182   Recent Labs  Lab 05/01/19 0932  APTT 35  INR 1.0         Other tests/results:  EXAM: LUMBAR SPINE - 2-3 VIEW 05/05/2019  COMPARISON:  05/03/2019  FINDINGS: Mild curvature of the lumbar spine is convex towards the  right. Pedicle screws and posterior rods extend from L2 through L5 with interbody spacers at L2-3, L3-4, and L4-5.  IMPRESSION: Status post posterior hardware fixation and interbody fusion of L2 through L5.   Assessment/Plan:  Frederick Hood is POD2 s/p L2-L5 XLIF. He is recovering well. Pain adequately controlled on current pain regimen. Ambulating well with walker. Voiding without issue. Drains were removed without issue. He is scheduled to follow up in approximately 2 weeks in clinic to monitor progress. Advised to contact office if any questions or concerns arise before then. Set up to receive [facility] home PT   Ivar Drape PA-C Department of Neurosurgery

## 2019-05-05 NOTE — Progress Notes (Signed)
Physical Therapy Treatment Patient Details Name: Frederick Hood MRN: [id number] DOB: [birth year] Today's Date: 05/05/2019    History of Present Illness 74 yo male s/p lumbar fusion on 05/03/19. PMH includes HTN, anxiety, depression, TIA    PT Comments    Pt up in chair, ready to walk.  Stated pain is comparable to yesterday.  Able to stand and complete 1 lap around unit with slow cautious gait with decreased step height and length with decreases in quality as he fatigues but no LOB or buckling.   Opted to return to bed to rest after gait.    Will address stair training next session.    Pt lives alone and does not plan on having any outside help upon discharge.  Encouraged him to have +1 assist for a few days until mobility improved for general safety.  "I can if I need to."  Voiced understanding.   Follow Up Recommendations  Follow surgeon's recommendation for DC plan and follow-up therapies;Home health PT     Equipment Recommendations  Rolling walker with 5" wheels;3in1 (PT)    Recommendations for Other Services       Precautions / Restrictions Precautions Precautions: Fall;Back Precaution Booklet Issued: No Precaution Comments: pt able to recall 3/3 precautions this session Required Braces or Orthoses: Spinal Brace Spinal Brace: Lumbar corset Restrictions Weight Bearing Restrictions: No    Mobility  Bed Mobility Overal bed mobility: Needs Assistance Bed Mobility: Sit to Supine       Sit to supine: Min assist   General bed mobility comments: education for log rolling technique.  Needs cues throughout.  Transfers Overall transfer level: Needs assistance Equipment used: Rolling walker (2 wheeled) Transfers: Sit to/from Stand Sit to Stand: Min guard;Min assist            Ambulation/Gait Ambulation/Gait assistance: Min guard;Min assist Gait Distance (Feet): 180 Feet Assistive device: Rolling walker (2 wheeled) Gait Pattern/deviations: Step-through  pattern;Decreased step length - right;Decreased step length - left;Trunk flexed Gait velocity: dec   General Gait Details: slow caution gait pattern with decreased step length and height as he fatigued.  Pt stated gait was improved over yesterday.   Stairs             Wheelchair Mobility    Modified Rankin (Stroke Patients Only)       Balance Overall balance assessment: Mild deficits observed, not formally tested                                          Cognition Arousal/Alertness: Awake/alert Behavior During Therapy: WFL for tasks assessed/performed Overall Cognitive Status: Within Functional Limits for tasks assessed                                        Exercises      General Comments        Pertinent Vitals/Pain Pain Assessment: 0-10 Pain Score: 4  Pain Location: low back, increased grimacing during stand Pain Descriptors / Indicators: Aching;Sore;Operative site guarding;Grimacing Pain Intervention(s): Limited activity within patient's tolerance;Monitored during session;Premedicated before session;Repositioned    Home Living                      Prior Function            PT  Goals (current goals can now be found in the care plan section) Progress towards PT goals: Progressing toward goals    Frequency    BID      PT Plan Current plan remains appropriate    Co-evaluation              AM-PAC PT "6 Clicks" Mobility   Outcome Measure  Help needed turning from your back to your side while in a flat bed without using bedrails?: A Little Help needed moving from lying on your back to sitting on the side of a flat bed without using bedrails?: A Little Help needed moving to and from a bed to a chair (including a wheelchair)?: A Little Help needed standing up from a chair using your arms (e.g., wheelchair or bedside chair)?: A Little Help needed to walk in hospital room?: A Little Help needed climbing  3-5 steps with a railing? : A Little 6 Click Score: 18    End of Session Equipment Utilized During Treatment: Gait belt Activity Tolerance: Patient tolerated treatment well;Patient limited by fatigue Patient left: in bed;with call bell/phone within reach;with bed alarm set Nurse Communication: Mobility status       Time: 2671-2458 PT Time Calculation (min) (ACUTE ONLY): 15 min  Charges:  $Gait Training: 8-22 mins                    Danielle Dess, PTA 05/05/19, 10:07 AM

## 2019-05-05 NOTE — Progress Notes (Signed)
Physical Therapy Treatment Patient Details Name: Frederick Hood MRN: [id number] DOB: 1945-06-09 Today's Date: 05/05/2019    History of Present Illness 74 yo male s/p lumbar fusion on 05/03/19. PMH includes HTN, anxiety, depression, TIA    PT Comments    Pt ready for discharge home.  Needs stair training.  He is able to complete stair training and x 1 lap around unit with RW and min guard +1.  Discussed at length safety in regards to mobility at home and discharge.  Sister will take him home.  Encouraged him to have her with him with gait from car to home and +1 assist with mobility at home.  He is encouraged to ask her to stay and he voices understanding and stated it would be a good idea.  While he has no LOB or bucking, gait remains unsteady and hesitant at times.  He relies on BUE support on walker and will have trouble with meals at home but remains firm in his decision to discharge home.  Some safety deficits noted as phone is ringing upon return to room and he hurries to answer it sitting quickly on bed.  Also attempts to walk to sneakers on counter in TED hose.  Extensive education given and he voices understanding.  "I will"  He is anxious to discharge home and pack up his belongings which is impacting his decision making/safety.  Items within reach so he can arrange them as needed.  Chair alarm on.   Follow Up Recommendations  Follow surgeon's recommendation for DC plan and follow-up therapies;Home health PT     Equipment Recommendations  Rolling walker with 5" wheels;3in1 (PT)    Recommendations for Other Services       Precautions / Restrictions Precautions Precautions: Fall;Back Precaution Booklet Issued: No Precaution Comments: pt able to recall 3/3 precautions this session Required Braces or Orthoses: Spinal Brace Spinal Brace: Lumbar corset Restrictions Weight Bearing Restrictions: No Other Position/Activity Restrictions: No bending, lifting, twisting.    Mobility  Bed  Mobility Overal bed mobility: Needs Assistance Bed Mobility: Sit to Supine       Sit to supine: Min assist   General bed mobility comments: Not assessed.  Patient sitting up upon entry.  Transfers Overall transfer level: Needs assistance Equipment used: Rolling walker (2 wheeled) Transfers: Sit to/from [facility] Sit to Stand: Min guard Stand pivot transfers: Min guard       General transfer comment: Good compliance with back precautions  Ambulation/Gait Ambulation/Gait assistance: Min guard;Min assist Gait Distance (Feet): 180 Feet Assistive device: Rolling walker (2 wheeled) Gait Pattern/deviations: Step-through pattern;Decreased step length - right;Decreased step length - left;Trunk flexed Gait velocity: dec   General Gait Details: slow caution gait pattern with increased confidence this session   Stairs Stairs: Yes Stairs assistance: Min guard Stair Management: Two rails Number of Stairs: 4 General stair comments: does well.  stated back stairs has 2 rails.  Encouraged him to use those vs front with no rails.  voiced agreement.   Wheelchair Mobility    Modified Rankin (Stroke Patients Only)       Balance Overall balance assessment: Mild deficits observed, not formally tested                                          Cognition Arousal/Alertness: Awake/alert Behavior During Therapy: WFL for tasks assessed/performed Overall Cognitive Status: Within Functional  Limits for tasks assessed                                 General Comments: Continues to feel "groggy"      Exercises Other Exercises Other Exercises: safety education Other Exercises: Provided education on back precautions during functional tasks (no BLT) Other Exercises: Donning/doffing of LSO with SBA/SPV while seated.    General Comments        Pertinent Vitals/Pain Pain Assessment: Faces Pain Score: 3  Faces Pain Scale: Hurts a little  bit Pain Location: low back, increased grimacing during stand Pain Descriptors / Indicators: Aching;Sore;Grimacing Pain Intervention(s): Limited activity within patient's tolerance;Monitored during session;Repositioned    Home Living Family/patient expects to be discharged to:: Private residence Living Arrangements: Alone Available Help at Discharge: Family;Available 24 hours/day;Friend(s) Type of Home: House Home Access: Stairs to enter   Home Layout: Multi-level;Able to live on main level with bedroom/bathroom Home Equipment: Shower seat;Grab bars - tub/shower;Cane - single point;Shower seat - built in      Prior Function Level of Independence: Independent          PT Goals (current goals can now be found in the care plan section) Progress towards PT goals: Progressing toward goals    Frequency    BID      PT Plan Current plan remains appropriate    Co-evaluation              AM-PAC PT "6 Clicks" Mobility   Outcome Measure  Help needed turning from your back to your side while in a flat bed without using bedrails?: A Little Help needed moving from lying on your back to sitting on the side of a flat bed without using bedrails?: A Little Help needed moving to and from a bed to a chair (including a wheelchair)?: A Little Help needed standing up from a chair using your arms (e.g., wheelchair or bedside chair)?: A Little Help needed to walk in hospital room?: A Little Help needed climbing 3-5 steps with a railing? : A Little 6 Click Score: 18    End of Session Equipment Utilized During Treatment: Gait belt Activity Tolerance: Patient tolerated treatment well Patient left: in chair;with call bell/phone within reach;with chair alarm set Nurse Communication: Mobility status       Time: 1135-1151 PT Time Calculation (min) (ACUTE ONLY): 16 min  Charges:  $Gait Training: 8-22 mins                    Danielle Dess, PTA 05/05/19, 11:58 AM

## 2019-05-05 NOTE — Care Management Important Message (Signed)
Important Message  Patient Details  Name: Frederick Hood MRN: [id number] Date of Birth: 1945-08-12   Medicare Important Message Given:  N/A - LOS <3 / Initial given by admissions  Initial IM given 05/04/19  Olegario Messier A Majid Mccravy 05/05/2019, 8:35 AM

## 2019-05-05 NOTE — Discharge Instructions (Signed)
Your surgeon has performed an operation on your lumbar spine (low back) to fuse two or more of the vertebrae (bones) together. This procedure is performed to treat a number of different spinal problems, including narrowing of the spinal canal (stenosis), herniated discs, degenerative changes, and injuries.   Many times, patients feel better immediately after surgery and can "overdo it." Even if you feel well, it is important that you follow these activity guidelines. If you do not let your back heal properly from the surgery, you can increase the chance of return of your symptoms and other complications. The following are instructions to help in your recovery once you have been discharged from the hospital.   * Do not take anti-inflammatory medications for 3 months after surgery (naproxen [Aleve], ibuprofen [Advil, Motrin], etc.). These medications can prevent your bones from healing properly. * You may resume plavix after 14 days * Take tylenol,robaxin, and oxycodone for post-op pain. Take as needed and do not exceed recommended dosages.  * Continue taking a stool softener when on narcotics (oxycodone) due to side effect of constipation   Activity     No bending, lifting, or twisting ("BLT"). Avoid lifting objects heavier than 10 pounds (gallon milk jug).  Where possible, avoid household activities that involve lifting, bending, reaching, pushing, or pulling such as laundry, vacuuming, grocery shopping, and childcare. Try to arrange for help from friends and family for these activities while your back heals.   Increase physical activity slowly as tolerated.  Taking short walks is encouraged, but avoid strenuous exercise. Do not jog, run, bicycle, lift weights, or participate in any other exercises unless specifically allowed by your doctor. Avoid prolonged sitting, including car rides.   Talk to your doctor before resuming sexual activity.   You should not drive until cleared by your doctor.    Until released by your doctor, you should not return to work or school.  You should rest at home and let your body heal.   You may shower three days after your surgery.  After showering, lightly dab your incision dry. Do not take a tub bath or go swimming until approved by your doctor at your follow-up appointment.   If your doctor ordered a lumbar brace for you, you should wear it whenever you are out of bed. You may remove it when lying down or sleeping. You should also wear it when riding in a car. Not all back surgeries require a lumbar brace.   If you smoke, we strongly recommend that you quit.  Smoking has been proven to interfere with normal bone healing and will dramatically reduce the success rate of your surgery. Please contact [facility] (800-QUIT-NOW) and use the resources at www.[facility].com for assistance in stopping smoking.   Surgical Incision   If you have a dressing on your incision, remove it 2 days after your surgery. Keep your incision area clean and dry.   If you have staples or stitches on your incision, you should have a follow up scheduled for removal. If you do not have staples or stitches, you will have steri-strips (small pieces of surgical tape) or Dermabond glue. The steri-strips/glue should begin to peel away within about a week (it is fine if the steri-strips fall off before then). If the strips are still in place one week after your surgery, you may gently remove them.   Diet           You may return to your usual diet. Be sure to stay  hydrated.   When to Contact us   Although your surgery and recovery will likely be uneventful, you may have some residual numbness, aches, and pains in your back and/or legs. This is normal and should improve in the next few weeks.   However, should you experience any of the following, contact us immediately:   - New numbness or weakness   - Pain that is progressively getting worse, and is not relieved by your pain  medications or rest   - Bleeding, redness, swelling, pain, or drainage from surgical incision   - Chills or flu-like symptoms   - Fever greater than 101.0 F (38.3 C)   - Problems with bowel or bladder functions   - Difficulty breathing or shortness of breath   - Warmth, tenderness, or swelling in your calf   Contact Information   - During office hours (Monday-Friday 9 am to 5 pm), please call your physician at [phone]  - After hours and weekends, please call [phone] and an answering service will put you in touch with either Dr. Lacinda Axon or Dr. Izora Ribas.   - For a life-threatening emergency, call 911

## 2019-05-05 NOTE — Evaluation (Signed)
Occupational Therapy Evaluation Patient Details Name: Frederick Hood MRN: [id number] DOB: 1945-03-07 Today's Date: 05/05/2019    History of Present Illness 74 yo male s/p lumbar fusion on 05/03/19. PMH includes HTN, anxiety, depression, TIA   Clinical Impression   Patient agreeable to OT session this date.  Patient plans on discharging home today or tomorrow.  Discussed use of energy conservation techniques within the home to improve safety and reduce pain.  Targeted ADL retraining focusing on LB dressing using AE and compensatory techniques.  Patient demonstrated good carryover with CGA-SBA and extra time while maintaining back precautions.  Case manager and NP present for part of session to address concerns about discharge.  Patient tolerated well and would continue to benefit from skilled OT.  Based on today's performance, recommending [facility] as follow up.    Follow Up Recommendations  Home health OT    Equipment Recommendations  None recommended by OT    Recommendations for Other Services       Precautions / Restrictions Precautions Precautions: Fall;Back Precaution Booklet Issued: No Precaution Comments: pt able to recall 3/3 precautions this session Required Braces or Orthoses: Spinal Brace Spinal Brace: Lumbar corset Restrictions Weight Bearing Restrictions: No Other Position/Activity Restrictions: No bending, lifting, twisting.      Mobility Bed Mobility Overal bed mobility: Needs Assistance Bed Mobility: Sit to Supine       Sit to supine: Min assist   General bed mobility comments: Not assessed.  Patient sitting up upon entry.  Transfers Overall transfer level: Needs assistance Equipment used: Rolling walker (2 wheeled) Transfers: Sit to/from [facility] Sit to Stand: Min guard Stand pivot transfers: Min guard       General transfer comment: Good compliance with back precautions    Balance Overall balance assessment: Mild deficits observed,  not formally tested                                         ADL either performed or assessed with clinical judgement   ADL       Grooming: Wash/dry hands;Sitting;Set up           Upper Body Dressing : Sitting Upper Body Dressing Details (indicate cue type and reason): Able to don/doff brace with SPV and VCs Lower Body Dressing: Sit to/from stand Lower Body Dressing Details (indicate cue type and reason): Able to don/doff pants using sit<>stand with CGA and compensatory techniques.  Don/doff socks and TED hose using compensatory techniques while seated with SBA. Toilet Transfer: Designer, television/film set Details (indicate cue type and reason): Demonstrates good self pacing Toileting- Clothing Manipulation and Hygiene: Supervision/safety;Adhering to back precautions;Sitting/lateral lean       Functional mobility during ADLs: Min guard;Rolling walker;Cueing for sequencing;Cueing for safety       Vision Patient Visual Report: No change from baseline       Perception     Praxis      Pertinent Vitals/Pain Pain Assessment: 0-10(Patient also noted to continue to feel "groggy") Pain Score: 3  Pain Location: low back, increased grimacing during stand Pain Descriptors / Indicators: Aching;Sore;Grimacing Pain Intervention(s): Limited activity within patient's tolerance;Monitored during session;Repositioned     Hand Dominance Right   Extremity/Trunk Assessment             Communication Communication Communication: No difficulties   Cognition Arousal/Alertness: Awake/alert Behavior During Therapy: WFL for tasks assessed/performed Overall  Cognitive Status: Within Functional Limits for tasks assessed                                 General Comments: Continues to feel "groggy"   General Comments       Exercises Other Exercises Other Exercises: Completed ADL retraining regarding LB dressing with use of compensatory techniques and AE.   CGA-SBA with good carryover. Other Exercises: Provided education on back precautions during functional tasks (no BLT) Other Exercises: Donning/doffing of LSO with SBA/SPV while seated.   Shoulder Instructions      Home Living Family/patient expects to be discharged to:: Private residence Living Arrangements: Alone Available Help at Discharge: Family;Available 24 hours/day;Friend(s) Type of Home: House Home Access: Stairs to enter [employer] of Steps: 3   Home Layout: Multi-level;Able to live on main level with bedroom/bathroom     Bathroom Shower/Tub: Producer, television/film/video: Handicapped height     Home Equipment: Shower seat;Grab bars - tub/shower;Cane - single point;Shower seat - built in          Prior Functioning/Environment Level of Independence: Independent                 OT Problem List: Decreased strength;Decreased activity tolerance;Decreased safety awareness;Decreased knowledge of precautions;Decreased knowledge of use of DME or AE      OT Treatment/Interventions: Self-care/ADL training;Therapeutic exercise;Energy conservation;DME and/or AE instruction;Therapeutic activities;Patient/family education    OT Goals(Current goals can be found in the care plan section)    OT Frequency: Min 2X/week   Barriers to D/C:            Co-evaluation              AM-PAC OT "6 Clicks" Daily Activity     Outcome Measure                 End of Session Equipment Utilized During Treatment: Rolling walker;Gait belt  Activity Tolerance: Patient tolerated treatment well Patient left: in bed;with call bell/phone within reach;with bed alarm set;Other (comment)(Case manager in room)  OT Visit Diagnosis: Unsteadiness on feet (R26.81);Muscle weakness (generalized) (M62.81)                Time: 5449-2010 OT Time Calculation (min): 27 min Charges:  OT General Charges $OT Visit: 1 Visit OT Treatments $Self Care/Home Management : 23-37  mins  Louanne Belton, MS, OTR/L 05/05/19, 11:36 AM

## 2020-04-04 ENCOUNTER — Other Ambulatory Visit: Payer: Self-pay | Admitting: Neurosurgery

## 2020-04-04 DIAGNOSIS — G8929 Other chronic pain: Secondary | ICD-10-CM

## 2020-04-09 ENCOUNTER — Ambulatory Visit
Admission: RE | Admit: 2020-04-09 | Discharge: 2020-04-09 | Disposition: A | Discharge status: Home / Self Care | Payer: Medicare Other | Source: Ambulatory Visit | Attending: Neurosurgery | Admitting: Neurosurgery

## 2020-04-09 ENCOUNTER — Other Ambulatory Visit: Payer: Self-pay

## 2020-04-09 DIAGNOSIS — G8929 Other chronic pain: Secondary | ICD-10-CM | POA: Insufficient documentation

## 2020-04-09 DIAGNOSIS — M5441 Lumbago with sciatica, right side: Secondary | ICD-10-CM | POA: Diagnosis not present

## 2020-04-13 ENCOUNTER — Ambulatory Visit: Payer: Medicare Other

## 2020-04-18 ENCOUNTER — Other Ambulatory Visit: Payer: Self-pay | Admitting: Neurosurgery

## 2020-04-18 DIAGNOSIS — Z981 Arthrodesis status: Secondary | ICD-10-CM

## 2020-04-18 DIAGNOSIS — M5136 Other intervertebral disc degeneration, lumbar region: Secondary | ICD-10-CM

## 2020-04-18 DIAGNOSIS — M4316 Spondylolisthesis, lumbar region: Secondary | ICD-10-CM

## 2020-04-30 ENCOUNTER — Other Ambulatory Visit: Payer: Self-pay

## 2020-04-30 ENCOUNTER — Ambulatory Visit
Admission: RE | Admit: 2020-04-30 | Discharge: 2020-04-30 | Disposition: A | Discharge status: Home / Self Care | Payer: Medicare Other | Source: Ambulatory Visit | Attending: Neurosurgery | Admitting: Neurosurgery

## 2020-04-30 DIAGNOSIS — Z981 Arthrodesis status: Secondary | ICD-10-CM | POA: Diagnosis present

## 2020-04-30 DIAGNOSIS — M5136 Other intervertebral disc degeneration, lumbar region: Secondary | ICD-10-CM | POA: Insufficient documentation

## 2020-04-30 DIAGNOSIS — M4316 Spondylolisthesis, lumbar region: Secondary | ICD-10-CM | POA: Insufficient documentation

## 2020-05-10 ENCOUNTER — Other Ambulatory Visit: Payer: Self-pay

## 2020-05-10 ENCOUNTER — Emergency Department: Payer: Medicare Other

## 2020-05-10 ENCOUNTER — Emergency Department
Admission: EM | Admit: 2020-05-10 | Discharge: 2020-05-10 | Disposition: A | Discharge status: Home / Self Care | Payer: Medicare Other | Attending: Emergency Medicine | Admitting: Emergency Medicine

## 2020-05-10 ENCOUNTER — Encounter: Payer: Self-pay | Admitting: Emergency Medicine

## 2020-05-10 DIAGNOSIS — Y92009 Unspecified place in unspecified non-institutional (private) residence as the place of occurrence of the external cause: Secondary | ICD-10-CM | POA: Insufficient documentation

## 2020-05-10 DIAGNOSIS — Z8673 Personal history of transient ischemic attack (TIA), and cerebral infarction without residual deficits: Secondary | ICD-10-CM | POA: Diagnosis not present

## 2020-05-10 DIAGNOSIS — I10 Essential (primary) hypertension: Secondary | ICD-10-CM | POA: Diagnosis not present

## 2020-05-10 DIAGNOSIS — S3992XA Unspecified injury of lower back, initial encounter: Secondary | ICD-10-CM | POA: Diagnosis present

## 2020-05-10 DIAGNOSIS — Z79899 Other long term (current) drug therapy: Secondary | ICD-10-CM | POA: Diagnosis not present

## 2020-05-10 DIAGNOSIS — S300XXA Contusion of lower back and pelvis, initial encounter: Secondary | ICD-10-CM | POA: Insufficient documentation

## 2020-05-10 DIAGNOSIS — W010XXA Fall on same level from slipping, tripping and stumbling without subsequent striking against object, initial encounter: Secondary | ICD-10-CM | POA: Diagnosis not present

## 2020-05-10 DIAGNOSIS — M5416 Radiculopathy, lumbar region: Secondary | ICD-10-CM | POA: Diagnosis not present

## 2020-05-10 MED ORDER — KETOROLAC TROMETHAMINE 10 MG PO TABS: 10.0000 mg | ORAL_TABLET | Freq: Three times a day (TID) | ORAL | 0 refills | Status: DC

## 2020-05-10 MED ORDER — OXYCODONE-ACETAMINOPHEN 5-325 MG PO TABS
1.0000 | ORAL_TABLET | Freq: Once | ORAL | Status: AC
  Administered 2020-05-10: 1 via ORAL
  Filled 2020-05-10: qty 1

## 2020-05-10 MED ORDER — ORPHENADRINE CITRATE 30 MG/ML IJ SOLN
60.0000 mg | INTRAMUSCULAR | Status: AC
  Administered 2020-05-10: 60 mg via INTRAMUSCULAR
  Filled 2020-05-10: qty 2

## 2020-05-10 MED ORDER — KETOROLAC TROMETHAMINE 30 MG/ML IJ SOLN
30.0000 mg | Freq: Once | INTRAMUSCULAR | Status: AC
  Administered 2020-05-10: 30 mg via INTRAMUSCULAR
  Filled 2020-05-10: qty 1

## 2020-05-10 MED ORDER — ORPHENADRINE CITRATE ER 100 MG PO TB12: 100.0000 mg | ORAL_TABLET | Freq: Two times a day (BID) | ORAL | 0 refills | Status: DC | PRN

## 2020-05-10 MED ORDER — OXYCODONE-ACETAMINOPHEN 5-325 MG PO TABS: 1.0000 | ORAL_TABLET | Freq: Three times a day (TID) | ORAL | 0 refills | Status: AC | PRN

## 2020-05-10 NOTE — ED Triage Notes (Signed)
Pt here with c/o right sided sciatic pain, had dexamethasone shot last week for the pain, last Friday, slipped and fell, landed on tailbone, states the pain has been worse, can hardly walk. Also c/o difficulty urinating the past few days. NAD.

## 2020-05-10 NOTE — ED Notes (Signed)
See triage note  Presents s/p fall last week  States he slipped and landed on tailbone

## 2020-05-10 NOTE — ED Provider Notes (Signed)
[facility] Emergency Department Provider Note ____________________________________________  Time seen: 73  I have reviewed the triage vital signs and the nursing notes.  HISTORY  Chief Complaint  Back Pain   HPI Frederick Hood is a 75 y.o. male with a history of Sx: XLIF 04/2019 and ESI: 2/22 & 3/22 with Dr. Yves Dill presents for acute on chronic LBP.  He reports onset last week after a mechanical fall at home on 05/03/20. He describes slipping in socked feet at home, landing on his right buttocks. Since that time, he has experienced an acute flare of his previous right-sided sciatica. He was concerned due to the flare, for injury or fracture to his hardware or tailbone. He has not attempted to reach out any of his providers for evaluation or pain management following his fall. He was previously prescribed Celebrex and Gabapentin. He has discontinued both meds on his own accord, without discussion with his providers. He denies any bladder or bowel incontinence, foot drop, or saddle anesthesias.   Past Medical History:  Diagnosis Date  . Anxiety   . Depression   . GERD (gastroesophageal reflux disease)   . Hypertension   . TIA (transient ischemic attack)    2019    Patient Active Problem List   Diagnosis Date Noted  . S/P lumbar fusion 05/03/2019    Past Surgical History:  Procedure Laterality Date  . ANTERIOR LATERAL LUMBAR FUSION WITH PERCUTANEOUS SCREW 3 LEVEL N/A 05/03/2019   Procedure: L2-5 XLIF, L2-5 POSTERIOR FUSION;  Surgeon: Venetia Night, MD;  Location: [facility] ORS;  Service: Neurosurgery;  Laterality: N/A;  . APPENDECTOMY    . BACK SURGERY    . ROTATOR CUFF REPAIR Right     Prior to Admission medications   Medication Sig Start Date End Date Taking? Authorizing Provider  ketorolac (TORADOL) 10 MG tablet Take 1 tablet (10 mg total) by mouth every 8 (eight) hours. 05/10/20  Yes Menshew, Charlesetta Ivory, PA-C  methocarbamol (ROBAXIN) 500 MG tablet  Take 500 mg by mouth every 6 (six) hours as needed for muscle spasms.   Yes [provider]  orphenadrine (NORFLEX) 100 MG tablet Take 1 tablet (100 mg total) by mouth 2 (two) times daily as needed for muscle spasms. 05/10/20  Yes Menshew, Charlesetta Ivory, PA-C  oxyCODONE-acetaminophen (PERCOCET) 5-325 MG tablet Take 1 tablet by mouth every 8 (eight) hours as needed for severe pain. 05/10/20 05/10/21 Yes Joni Reining, PA-C  acetaminophen (TYLENOL) 500 MG tablet Take 500-1,000 mg by mouth daily as needed (pain.).    [provider]  acetaminophen (TYLENOL) 500 MG tablet Take 1 tablet (500 mg total) by mouth every 6 (six) hours as needed. 05/05/19   Ivar Drape, PA-C  citalopram (CELEXA) 20 MG tablet Take 20-30 mg by mouth daily. 11/03/18   [provider]  famotidine (PEPCID) 20 MG tablet Take 20 mg by mouth at bedtime.    [provider]  lisinopril (ZESTRIL) 20 MG tablet Take 30 mg by mouth at bedtime. 01/17/19   [provider]  metoprolol succinate (TOPROL-XL) 50 MG 24 hr tablet Take 50 mg by mouth daily with breakfast. 03/20/19   [provider]  senna (SENOKOT) 8.6 MG TABS tablet Take 1 tablet (8.6 mg total) by mouth daily. 05/05/19   Ivar Drape, PA-C    Allergies Patient has no known allergies.  History reviewed. No pertinent family history.  Social History Social History   Tobacco Use  . Smoking status: Never  Smoker  . Smokeless tobacco: Never Used  Vaping Use  . Vaping Use: Never used  Substance Use Topics  . Alcohol use: Not Currently  . Drug use: No    Review of Systems  Constitutional: Negative for fever.  Cardiovascular: Negative for chest pain. Respiratory: Negative for shortness of breath. Gastrointestinal: Negative for abdominal pain, vomiting and diarrhea. Genitourinary: Negative for dysuria. Musculoskeletal: Positive for back pain with RLE referral  Skin: Negative for rash. Neurological: Negative for  headaches, focal weakness or numbness. ____________________________________________  PHYSICAL EXAM:  VITAL SIGNS: ED Triage Vitals  Enc Vitals Group     BP 05/10/20 0933 (!) 153/76     Pulse Rate 05/10/20 0933 78     Resp 05/10/20 0933 16     Temp 05/10/20 0933 98.5 F (36.9 C)     Temp Source 05/10/20 0933 Oral     SpO2 05/10/20 0933 99 %     Weight 05/10/20 0926 180 lb (81.6 kg)     Height 05/10/20 0926 6\' 1"  (1.854 m)     Head Circumference --      Peak Flow --      Pain Score 05/10/20 0926 9     Pain Loc --      Pain Edu? --      Excl. in GC? --     Constitutional: Alert and oriented. Well appearing and in no distress. Head: Normocephalic and atraumatic. Eyes: Conjunctivae are normal. Normal extraocular movements Cardiovascular: Normal rate, regular rhythm. Normal distal pulses. Respiratory: Normal respiratory effort. No wheezes/rales/rhonchi. Gastrointestinal: Soft and nontender. No distention.  No CVA tenderness Musculoskeletal: Normal spinal alignment without midline tenderness, spasm, deformity, or step-off.  Patient is tender to palpation over the right gluteal musculature.  No significant tenderness to the midline coccyx or tailbone.  Nontender with normal range of motion in all extremities.  Neurologic: Cranial nerves II to XII grossly intact.  Normal LE DTRs bilaterally.  Normal toe dorsiflexion on the left, decreased dorsiflexion on the right at baseline.  Negative seated straight leg raise bilaterally.  Normal gait without ataxia. Normal speech and language. No gross focal neurologic deficits are appreciated. Skin:  Skin is warm, dry and intact. No rash noted. ____________________________________________   RADIOLOGY  CT Lumbar Spine w/o CM  IMPRESSION: No substantial change since 04/30/2020.  No new fracture  ____________________________________________  PROCEDURES  Toradol 30 mg IM Norflex 60 mg IM Percocet 5-325 mg  PO  Procedures ____________________________________________  INITIAL IMPRESSION / ASSESSMENT AND PLAN / ED COURSE  DDX: lumbar radiculopathy, lumbar spine fracture, spinal cord compression  Patient ED evaluation of acute on chronic right-sided radiculopathy following mechanical fall.  Patient with a history of radiculitis on the right, presents after mechanical fall onto the right buttocks.  Symptoms do seem to aggravate his previous history of sciatic nerve irritation.  Will be treated empirically with anti-inflammatories in the form of ketorolac, Norflex as an antispasm medicine, and a small course of Norco for ongoing pain relief.  Frederick Hood was evaluated in Emergency Department on 05/10/2020 for the symptoms described in the history of present illness. He was evaluated in the context of the global COVID-19 pandemic, which necessitated consideration that the patient might be at risk for infection with the SARS-CoV-2 virus that causes COVID-19. Institutional protocols and algorithms that pertain to the evaluation of patients at risk for COVID-19 are in a state of rapid change based on information released by regulatory bodies including the CDC and federal and  state organizations. These policies and algorithms were followed during the patient's care in the ED.  I reviewed the patient's prescription history over the last 12 months in the multi-state controlled substances database(s) that includes [place], [place], [place], [place], [place], [place], [place], [place], [place], [place], [place], [place], [place], and [place].  Results were notable for no current RX.  ____________________________________________  FINAL CLINICAL IMPRESSION(S) / ED DIAGNOSES  Final diagnoses:  Right lumbar radiculitis  Contusion of buttock, initial encounter      Lissa Hoard, PA-C 05/10/20 1836    Minna Antis, MD 05/14/20 1416

## 2020-05-10 NOTE — Discharge Instructions (Signed)
Your exam and CT scan are normal and reassuring. Take the prescribed meds as directed. Follow-up with Dr. Marcell Barlow as discussed.

## 2020-05-10 NOTE — ED Notes (Signed)
Pt states he went to the bathroom before triage and was able to empty his bladder fully.

## 2020-05-10 NOTE — ED Notes (Signed)
States he hasn't been drinking enough water the past few days as well.

## 2020-05-10 NOTE — ED Notes (Signed)
Pt using lobby bathroom.

## 2021-10-03 IMAGING — CT CT L SPINE W/O CM
3 series · 11 of 35 positions shown, 13 images · non-contrast
Comparison: 04/30/2020

CLINICAL DATA: Low back pain fall

EXAM:
CT LUMBAR SPINE WITHOUT CONTRAST
TECHNIQUE: Multidetector CT imaging of the lumbar spine was performed without
intravenous contrast administration. Multiplanar CT image
reconstructions were also generated.

[Series 4: l spine soft · axial · 0.31mm/px · z∈[-326,-164]mm · 3 of 133 slices shown, 4 images]
[im 31/133  soft-tissue]
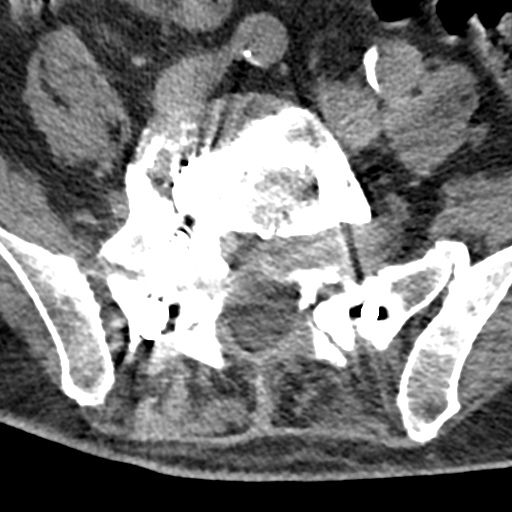
[im 31/133  bone]
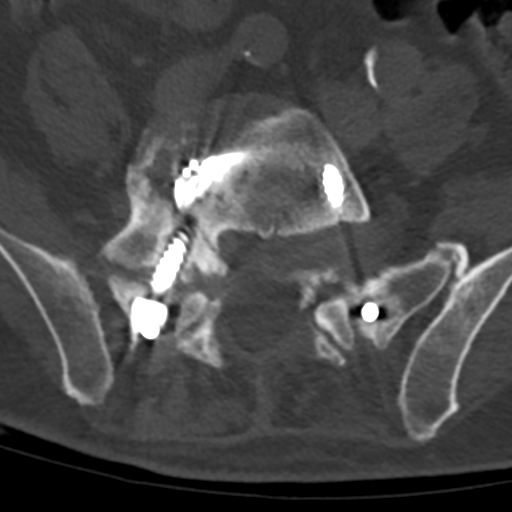
[im 72/133  bone]
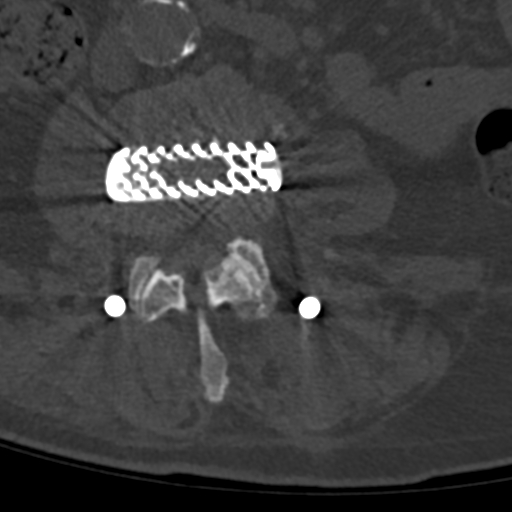
[im 112/133  bone]
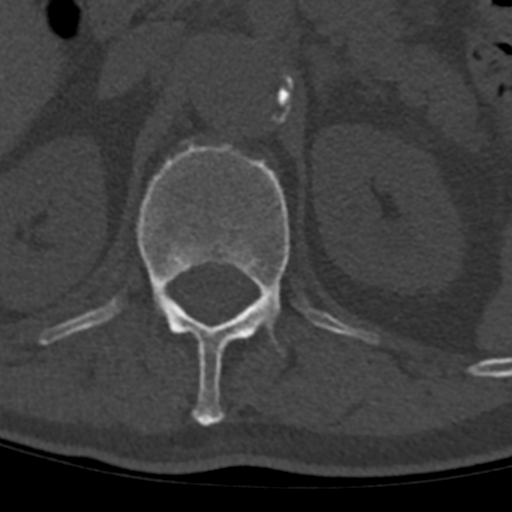

[Series 5: coronal bone · coronal · 0.39mm/px · 3 of 65 slices shown]
[im 13/65  bone]
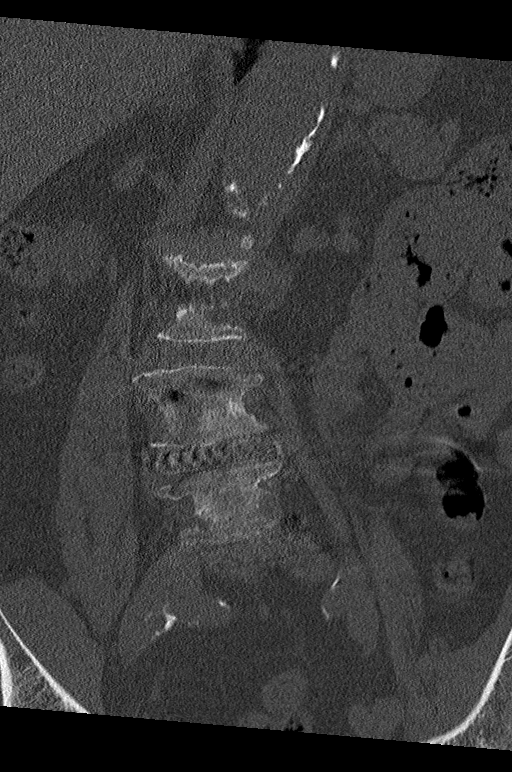
[im 26/65  bone]
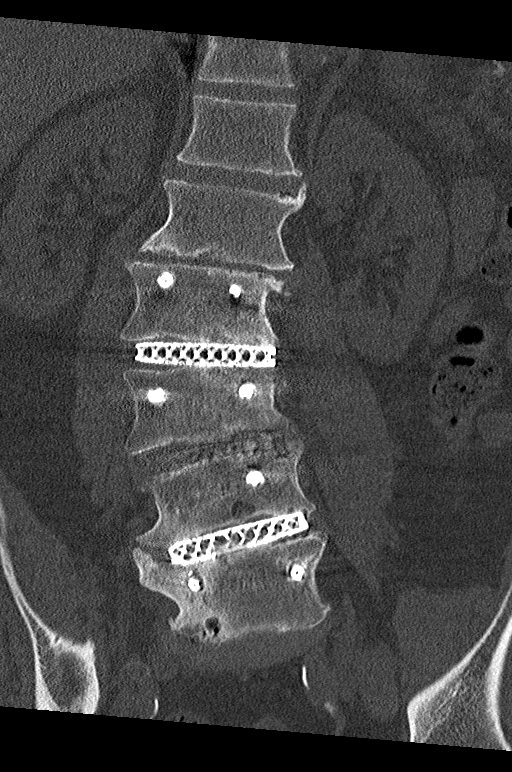
[im 39/65  bone]
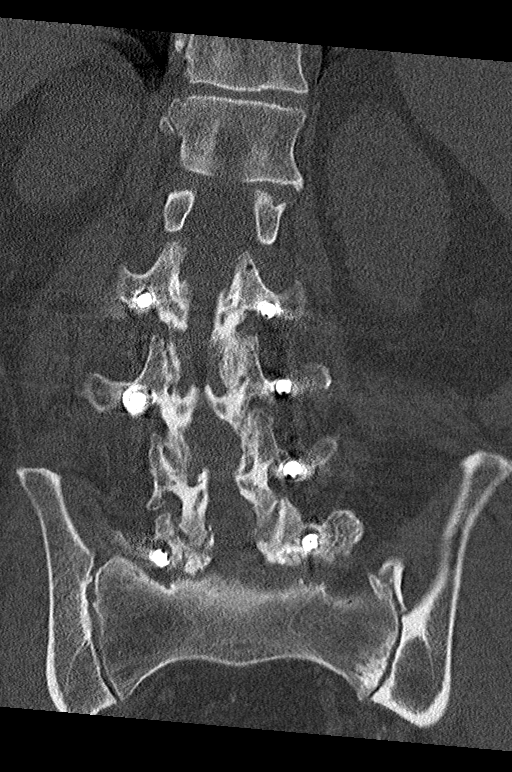

[Series 6: sagittal st · sagittal · 0.31mm/px · 5 of 100 slices shown, 6 images]
[im 34/100  bone]
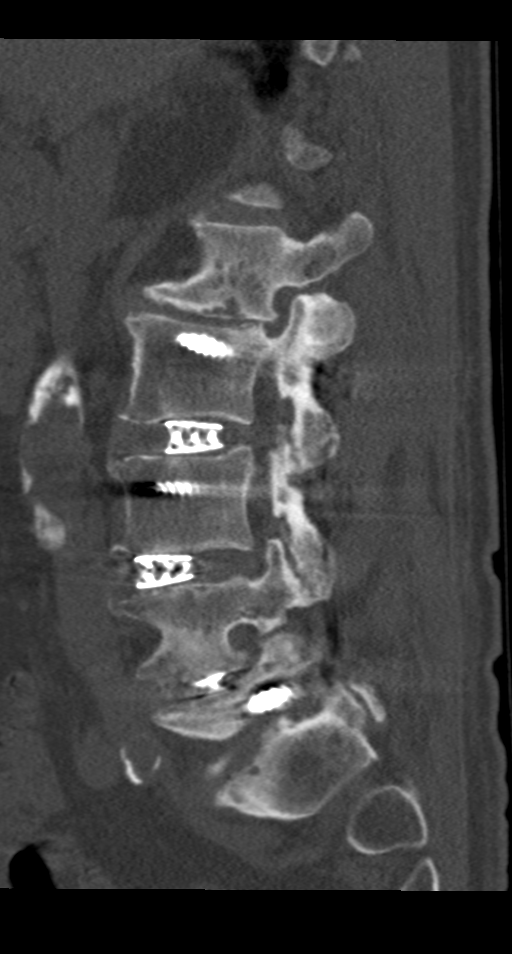
[im 42/100  bone]
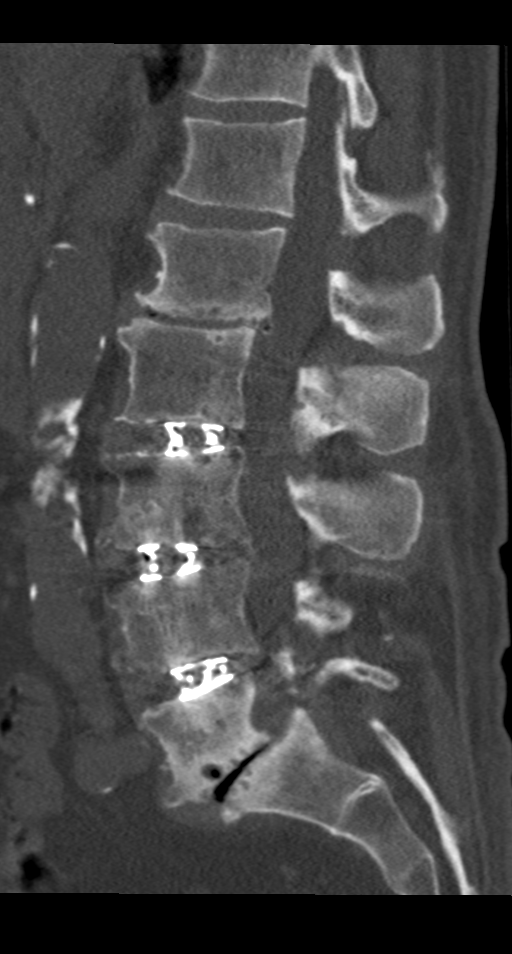
[im 50/100  soft-tissue]
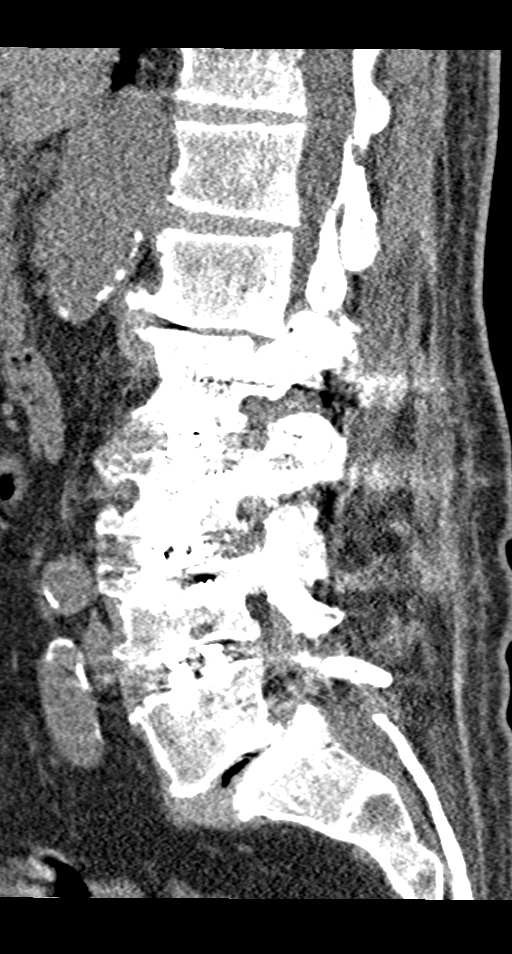
[im 50/100  bone]
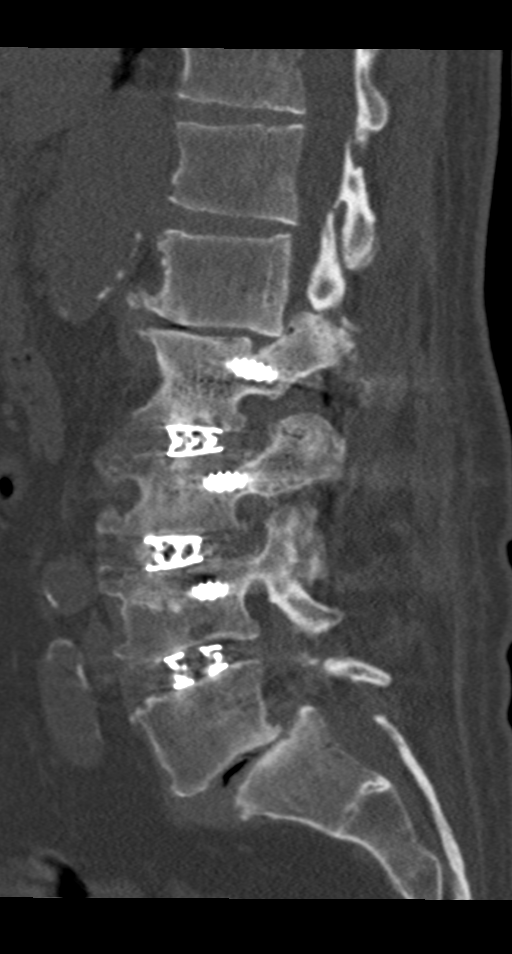
[im 58/100  bone]
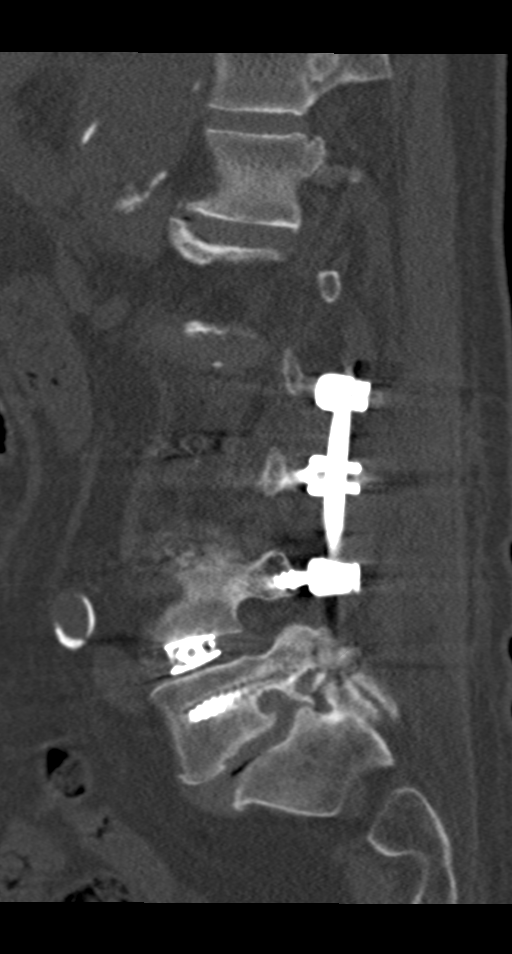
[im 67/100  bone]
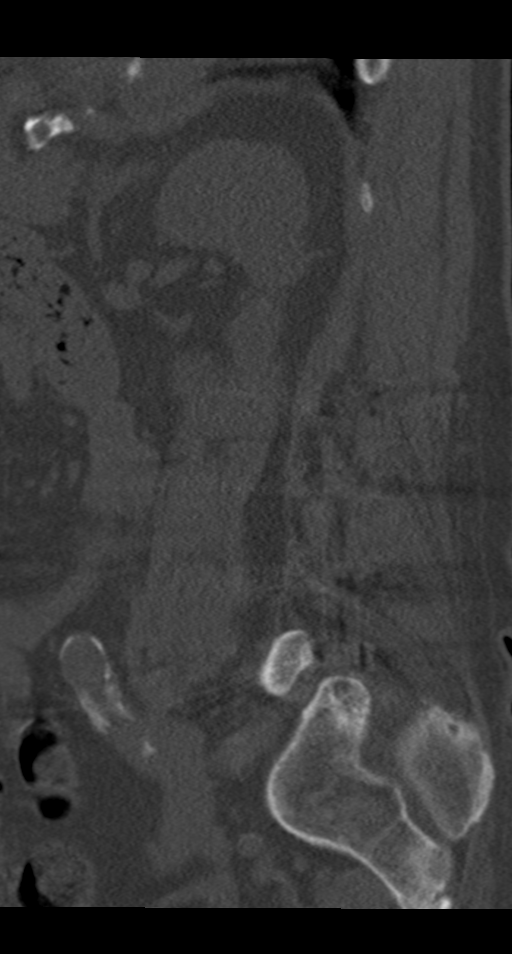

[11 of 35 positions shown; findings below may reference images not displayed]

FINDINGS: Segmentation: 5 lumbar type vertebrae.

Alignment: Stable including grade 2 anterolisthesis at L5-S1
secondary to chronic pars breaks and dextrocurvature.

Vertebrae: Stable vertebral body heights. No new fracture. Vertical
non-acute fracture through the right L5 pedicle is again identified.
Postoperative changes are again identified from L2-L5. Pedicle screw
loosening is again identified at L5 with surrounding lucency no
substantial interbody fusion.

Paraspinal and other soft tissues: No new finding.

Disc levels: Degenerative changes are stable over the short
interval.
IMPRESSION: No substantial change since 04/30/2020.  No new fracture

## 2023-06-30 ENCOUNTER — Encounter: Payer: Self-pay | Admitting: Neurosurgery

## 2023-06-30 NOTE — Progress Notes (Unsigned)
 Referring Physician:  No referring provider defined for this encounter.  Primary Physician:  [facility]  History of Present Illness: 07/01/2023 Mr. Frederick Hood is here today with a chief complaint of right leg pain in his buttock and right ankle.  This is worse when he sits.  Standing and walking make it better.  It has been ongoing for 4 to 5 months.  He previously had surgery in 2021 and did well until recently.  He has limited back pain.    Bowel/Bladder Dysfunction: none  Conservative measures:  Physical therapy: has participated in at [facility] from 04/19/23 to 06/10/23 did not help.  Multimodal medical therapy including regular antiinflammatories:  tylenol , robaxin  Injections:   05/01/2020: Right L2-3 and right S1 transforaminal ESI 04/11/2020: Right S1 transforaminal ESI (good relief x4 days)   Past Surgery: 05/03/19: Procedure: L2-5 XLIF, L2-5 POSTERIOR FUSION  Frederick Hood has no symptoms of cervical myelopathy.  The symptoms are causing a significant impact on the patient's life.   I have utilized the care everywhere function in epic to review the outside records available from external health systems.  Review of Systems:  A 10 point review of systems is negative, except for the pertinent positives and negatives detailed in the HPI.  Past Medical History: Past Medical History:  Diagnosis Date   Anxiety    Depression    GERD (gastroesophageal reflux disease)    Hypertension    TIA (transient ischemic attack)    2019    Past Surgical History: Past Surgical History:  Procedure Laterality Date   ANTERIOR LATERAL LUMBAR FUSION WITH PERCUTANEOUS SCREW 3 LEVEL N/A 05/03/2019   Procedure: L2-5 XLIF, L2-5 POSTERIOR FUSION;  Surgeon: Jodeen Munch, MD;  Location: [facility] ORS;  Service: Neurosurgery;  Laterality: N/A;   APPENDECTOMY     BACK SURGERY     ROTATOR CUFF REPAIR Right     Allergies: Allergies as of 07/01/2023   (No Known Allergies)     Medications:  Current Outpatient Medications:    acetaminophen  (TYLENOL ) 500 MG tablet, Take 1 tablet (500 mg total) by mouth every 6 (six) hours as needed., Disp: 30 tablet, Rfl: 0   citalopram  (CELEXA ) 20 MG tablet, Take 20-30 mg by mouth daily., Disp: , Rfl:    famotidine  (PEPCID ) 20 MG tablet, Take 20 mg by mouth at bedtime., Disp: , Rfl:    lisinopril  (ZESTRIL ) 20 MG tablet, Take 30 mg by mouth at bedtime., Disp: , Rfl:    methocarbamol  (ROBAXIN ) 500 MG tablet, Take 500 mg by mouth every 6 (six) hours as needed for muscle spasms., Disp: , Rfl:    metoprolol  succinate (TOPROL -XL) 50 MG 24 hr tablet, Take 50 mg by mouth daily with breakfast., Disp: , Rfl:    senna (SENOKOT) 8.6 MG TABS tablet, Take 1 tablet (8.6 mg total) by mouth daily., Disp: 30 tablet, Rfl: 0  Social History: Social History   Tobacco Use   Smoking status: Never   Smokeless tobacco: Never  Vaping Use   Vaping status: Never Used  Substance Use Topics   Alcohol use: Not Currently   Drug use: No    Family Medical History: No family history on file.  Physical Examination: Vitals:   07/01/23 1359  BP: (!) 146/80    General: Patient is in no apparent distress. Attention to examination is appropriate.  Neck:   Supple.  Full range of motion.  Respiratory: Patient is breathing without any difficulty.   NEUROLOGICAL:  Awake, alert, oriented to person, place, and time.  Speech is clear and fluent.   Cranial Nerves: Pupils equal round and reactive to light.  Facial tone is symmetric.  Facial sensation is symmetric. Shoulder shrug is symmetric. Tongue protrusion is midline.  There is no pronator drift.  Strength: Side Biceps Triceps Deltoid Interossei Grip Wrist Ext. Wrist Flex.  R 5 5 5 5 5 5 5   L 5 5 5 5 5 5 5    Side Iliopsoas Quads Hamstring PF DF EHL  R 5 5 5 5 2 2   L 5 5 5 5 5 5    Reflexes are 1+ and symmetric at the biceps, triceps, brachioradialis, patella and achilles.   Hoffman's is  absent.   Bilateral upper and lower extremity sensation is intact to light touch.    No evidence of dysmetria noted.  Gait is normal.     Medical Decision Making  Imaging: None  I have personally reviewed the images and agree with the above interpretation.  Assessment and Plan: Mr. Feingold is a pleasant 78 y.o. male with right buttock and ankle pain that could be the result of radiculopathy or some other pathology.  I would like to evaluate his fusion with CT scan.  I will get MRI scan of the thoracic and lumbar spine as well as the right hip.  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      Thank you for involving me in the care of this patient.      Anaiah Mcmannis K. Mont Antis MD, [facility] Neurosurgery

## 2023-07-01 ENCOUNTER — Encounter: Payer: Self-pay | Admitting: Neurosurgery

## 2023-07-01 ENCOUNTER — Ambulatory Visit: Admitting: Neurosurgery

## 2023-07-01 VITALS — BP 146/80 | Ht 72.76 in | Wt 183.6 lb

## 2023-07-01 DIAGNOSIS — M5416 Radiculopathy, lumbar region: Secondary | ICD-10-CM | POA: Diagnosis not present

## 2023-07-01 DIAGNOSIS — Z96641 Presence of right artificial hip joint: Secondary | ICD-10-CM

## 2023-07-01 DIAGNOSIS — M4316 Spondylolisthesis, lumbar region: Secondary | ICD-10-CM | POA: Diagnosis not present

## 2023-07-01 DIAGNOSIS — Z981 Arthrodesis status: Secondary | ICD-10-CM

## 2023-07-01 DIAGNOSIS — M419 Scoliosis, unspecified: Secondary | ICD-10-CM

## 2023-07-07 ENCOUNTER — Telehealth: Payer: Self-pay | Admitting: Neurosurgery

## 2023-07-07 NOTE — Telephone Encounter (Signed)
 Thoughts?

## 2023-07-07 NOTE — Telephone Encounter (Signed)
 Patient is calling to let our office know that he is now having pain in both hips and not just the right. He wanted to make our office aware of this since his imaging is scheduled for 07/13/2023. He was not sure if Dr. Mont Antis would want imaging of the left hip. Please advise.   Alternate # [phone]

## 2023-07-08 NOTE — Telephone Encounter (Signed)
 Patient called in wanting to know if he should continue with imaging- I let him know what was stated by the provider and to proceed with imaging.

## 2023-07-13 ENCOUNTER — Ambulatory Visit
Admission: RE | Admit: 2023-07-13 | Discharge: 2023-07-13 | Disposition: A | Discharge status: Home / Self Care | Source: Ambulatory Visit | Attending: Neurosurgery | Admitting: Neurosurgery

## 2023-07-13 DIAGNOSIS — Z96641 Presence of right artificial hip joint: Secondary | ICD-10-CM

## 2023-07-13 DIAGNOSIS — Z981 Arthrodesis status: Secondary | ICD-10-CM | POA: Insufficient documentation

## 2023-07-13 DIAGNOSIS — M4316 Spondylolisthesis, lumbar region: Secondary | ICD-10-CM | POA: Diagnosis present

## 2023-07-13 DIAGNOSIS — M419 Scoliosis, unspecified: Secondary | ICD-10-CM | POA: Diagnosis present

## 2023-08-11 ENCOUNTER — Encounter: Payer: Self-pay | Admitting: Neurosurgery

## 2023-08-17 ENCOUNTER — Encounter: Payer: Self-pay | Admitting: Neurosurgery

## 2023-08-17 ENCOUNTER — Ambulatory Visit (INDEPENDENT_AMBULATORY_CARE_PROVIDER_SITE_OTHER): Admitting: Neurosurgery

## 2023-08-17 VITALS — BP 138/78 | Ht 72.76 in | Wt 183.0 lb

## 2023-08-17 DIAGNOSIS — S76311A Strain of muscle, fascia and tendon of the posterior muscle group at thigh level, right thigh, initial encounter: Secondary | ICD-10-CM | POA: Diagnosis not present

## 2023-08-17 DIAGNOSIS — S76312A Strain of muscle, fascia and tendon of the posterior muscle group at thigh level, left thigh, initial encounter: Secondary | ICD-10-CM | POA: Diagnosis not present

## 2023-08-17 DIAGNOSIS — M419 Scoliosis, unspecified: Secondary | ICD-10-CM

## 2023-08-17 DIAGNOSIS — S76319A Strain of muscle, fascia and tendon of the posterior muscle group at thigh level, unspecified thigh, initial encounter: Secondary | ICD-10-CM

## 2023-08-17 NOTE — Progress Notes (Signed)
 Referring Physician:  Care, [facility] [address] [place],  [place] [address]  Primary Physician:  Care, [facility] Primary  History of Present Illness: 08/17/2023 Frederick Hood symptoms have been a little bit better over the past 3 weeks.  He has significant pain when he sits.  07/01/2023 Frederick Hood is here today with a chief complaint of right leg pain in his buttock and right ankle.  This is worse when he sits.  Standing and walking make it better.  It has been ongoing for 4 to 5 months.  He previously had surgery in 2021 and did well until recently.  He has limited back pain.    Bowel/Bladder Dysfunction: none  Conservative measures:  Physical therapy: has participated in at [facility] from 04/19/23 to 06/10/23 did not help.  Multimodal medical therapy including regular antiinflammatories:  tylenol , robaxin  Injections:   05/01/2020: Right L2-3 and right S1 transforaminal ESI 04/11/2020: Right S1 transforaminal ESI (good relief x4 days)   Past Surgery: 05/03/19: Procedure: L2-5 XLIF, L2-5 POSTERIOR FUSION  Frederick Hood has no symptoms of cervical myelopathy.  The symptoms are causing a significant impact on the patient's life.   I have utilized the care everywhere function in epic to review the outside records available from external health systems.  Review of Systems:  A 10 point review of systems is negative, except for the pertinent positives and negatives detailed in the HPI.  Past Medical History: Past Medical History:  Diagnosis Date   Anxiety    Depression    GERD (gastroesophageal reflux disease)    Hypertension    TIA (transient ischemic attack)    2019    Past Surgical History: Past Surgical History:  Procedure Laterality Date   ANTERIOR LATERAL LUMBAR FUSION WITH PERCUTANEOUS SCREW 3 LEVEL N/A 05/03/2019   Procedure: L2-5 XLIF, L2-5 POSTERIOR FUSION;  Surgeon: Clois Fret, MD;  Location: [facility] ORS;  Service: Neurosurgery;  Laterality: N/A;    APPENDECTOMY     BACK SURGERY     ROTATOR CUFF REPAIR Right     Allergies: Allergies as of 08/17/2023   (No Known Allergies)    Medications:  Current Outpatient Medications:    acetaminophen  (TYLENOL ) 500 MG tablet, Take 1 tablet (500 mg total) by mouth every 6 (six) hours as needed., Disp: 30 tablet, Rfl: 0   citalopram  (CELEXA ) 20 MG tablet, Take 20-30 mg by mouth daily., Disp: , Rfl:    famotidine  (PEPCID ) 20 MG tablet, Take 20 mg by mouth at bedtime., Disp: , Rfl:    lisinopril  (ZESTRIL ) 20 MG tablet, Take 30 mg by mouth at bedtime., Disp: , Rfl:    metoprolol  succinate (TOPROL -XL) 50 MG 24 hr tablet, Take 50 mg by mouth daily with breakfast., Disp: , Rfl:   Social History: Social History   Tobacco Use   Smoking status: Never   Smokeless tobacco: Never  Vaping Use   Vaping status: Never Used  Substance Use Topics   Alcohol use: Not Currently   Drug use: No    Family Medical History: No family history on file.  Physical Examination: Vitals:   08/17/23 1156  BP: 138/78    General: Patient is in no apparent distress. Attention to examination is appropriate.  Neck:   Supple.  Full range of motion.  Respiratory: Patient is breathing without any difficulty.   NEUROLOGICAL:     Awake, alert, oriented to person, place, and time.  Speech is clear and fluent.   Cranial  Nerves: Pupils equal round and reactive to light.  Facial tone is symmetric.  Facial sensation is symmetric. Shoulder shrug is symmetric. Tongue protrusion is midline.  There is no pronator drift.  Strength: Side Biceps Triceps Deltoid Interossei Grip Wrist Ext. Wrist Flex.  R 5 5 5 5 5 5 5   L 5 5 5 5 5 5 5    Side Iliopsoas Quads Hamstring PF DF EHL  R 5 5 5 5 2 2   L 5 5 5 5 5 5    Reflexes are 1+ and symmetric at the biceps, triceps, brachioradialis, patella and achilles.   Hoffman's is absent.   Bilateral upper and lower extremity sensation is intact to light touch.    No evidence of  dysmetria noted.  Gait is abnormal.     Medical Decision Making  Imaging: MRI T spine 07/13/2023 IMPRESSION: 1. Chronic degenerative disc disease at C7-T1 and T1-2 with moderate foraminal narrowing bilaterally at both levels. 2. Mild right foraminal narrowing at T2-3 and T3-4. 3. Mild foraminal narrowing on the right at T7-8 and bilaterally at T8-9.   Electronically signed by: Lonni Necessary MD 08/17/2023 07:12 AM EDT RP Workstation: [id number]  CT L spine 07/13/2023 IMPRESSION: 1. Solid fusion at L2-3, L3-4, and L4-5 with stable lucency about the pedicle screws at L5 and bilateral pars defects at L5. 2. Stable 12 mm grade 2 anterolisthesis at L5-S1 and 4 mm retrolisthesis at L1-2. 3. Progressive sclerotic endplate change and loss of disc height at L1-2. 4. Progressive severe left and moderate right foraminal stenosis at L1-2. 5. Stable moderate right and mild left foraminal narrowing at L2-3. 6. Stable moderate left and mild right foraminal narrowing at L3-4 with left laminectomy. 7. Stable moderate foraminal stenosis at L4-5, right greater than left. 8. Uncovering of a broad-based disc protrusion at L5-S1 with moderate bilateral foraminal stenosis, worse on the left.   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 Also moderate to severe degenerative disc disease at these levels with moderate discogenic endplate marrow edema.   Multilevel degenerative spondylosis with levels described in detail above.   L1-2 has a moderate degenerative central stenosis and moderate to severe bilateral degenerative foraminal narrowings.   L5-S1 has moderate to severe bilateral degenerative foraminal narrowings with contact and suspected impingement of the exiting L5 nerves.   Electronically  signed by: Reyes Frees MD 08/13/2023 08:29 AM EDT RP Workstation: [id number]  MR R hip 07/13/2023 IMPRESSION: 1. Moderate degenerative chondral thinning in both hips. 2. Probable superior labral tear on the right. 3. Proximal hamstring partial tearing bilaterally. 4. Fluid signal in the pubic symphysis with associated spurring, but without adjacent marrow edema. This could be degenerative or inflammatory. 5. Multilevel lumbar fusion with associated metal artifact. Notable spondylosis and degenerative disc disease at L5-S1.     Electronically Signed   By: Ryan Salvage M.D.   On: 08/09/2023 14:43   I have personally reviewed the images and agree with the above interpretation.  Assessment and Plan: Frederick Hood is a pleasant 78 y.o. male with buttock and ankle pain that could be the result of radiculopathy or some other pathology.  His pain is worse with sitting.  His MRI scan of his hip shows hamstring tears.  I will like this to get further evaluated.  He is not interested in  considering any further spinal intervention at this time.      Thank you for involving me in the care of this patient.      Aaditya Letizia K. Clois MD, [facility] Neurosurgery
